# Patient Record
Sex: Male | Born: 1945 | Race: White | Hispanic: No | Marital: Married | Smoking: Never smoker
Health system: Southern US, Community
[De-identification: ages and names within clinical notes are randomized; demographics above are authoritative.]

## PROBLEM LIST (undated history)

## (undated) DIAGNOSIS — I10 Essential (primary) hypertension: Secondary | ICD-10-CM

## (undated) DIAGNOSIS — I4891 Unspecified atrial fibrillation: Secondary | ICD-10-CM

---

## 1999-12-26 ENCOUNTER — Ambulatory Visit (HOSPITAL_COMMUNITY): Admission: RE | Admit: 1999-12-26 | Discharge: 1999-12-26 | Payer: Self-pay | Admitting: Cardiology

## 2002-08-02 ENCOUNTER — Encounter: Payer: Self-pay | Admitting: Internal Medicine

## 2003-04-06 ENCOUNTER — Encounter: Payer: Self-pay | Admitting: Internal Medicine

## 2003-04-11 ENCOUNTER — Encounter: Payer: Self-pay | Admitting: Internal Medicine

## 2003-07-04 ENCOUNTER — Ambulatory Visit (HOSPITAL_COMMUNITY): Admission: RE | Admit: 2003-07-04 | Discharge: 2003-07-04 | Payer: Self-pay | Admitting: Cardiovascular Disease

## 2003-08-21 ENCOUNTER — Ambulatory Visit (HOSPITAL_COMMUNITY): Admission: RE | Admit: 2003-08-21 | Discharge: 2003-08-21 | Payer: Self-pay | Admitting: Cardiovascular Disease

## 2003-11-21 ENCOUNTER — Inpatient Hospital Stay (HOSPITAL_BASED_OUTPATIENT_CLINIC_OR_DEPARTMENT_OTHER): Admission: RE | Admit: 2003-11-21 | Discharge: 2003-11-21 | Payer: Self-pay | Admitting: *Deleted

## 2003-12-26 ENCOUNTER — Ambulatory Visit (HOSPITAL_COMMUNITY): Admission: RE | Admit: 2003-12-26 | Discharge: 2003-12-26 | Payer: Self-pay | Admitting: Cardiovascular Disease

## 2004-01-30 ENCOUNTER — Ambulatory Visit: Payer: Self-pay | Admitting: Cardiovascular Disease

## 2004-01-30 ENCOUNTER — Ambulatory Visit: Payer: Self-pay | Admitting: *Deleted

## 2004-02-13 ENCOUNTER — Ambulatory Visit: Payer: Self-pay | Admitting: Cardiology

## 2004-03-10 ENCOUNTER — Ambulatory Visit: Payer: Self-pay | Admitting: Cardiovascular Disease

## 2004-04-07 ENCOUNTER — Ambulatory Visit: Payer: Self-pay | Admitting: Cardiovascular Disease

## 2004-04-09 ENCOUNTER — Ambulatory Visit (HOSPITAL_COMMUNITY): Admission: RE | Admit: 2004-04-09 | Discharge: 2004-04-09 | Payer: Self-pay | Admitting: Cardiovascular Disease

## 2004-04-09 ENCOUNTER — Ambulatory Visit: Payer: Self-pay | Admitting: Cardiovascular Disease

## 2004-04-14 ENCOUNTER — Ambulatory Visit: Payer: Self-pay | Admitting: Internal Medicine

## 2004-04-25 ENCOUNTER — Ambulatory Visit: Payer: Self-pay | Admitting: Cardiovascular Disease

## 2004-06-24 ENCOUNTER — Ambulatory Visit: Payer: Self-pay | Admitting: Cardiology

## 2004-06-27 ENCOUNTER — Ambulatory Visit: Payer: Self-pay | Admitting: Cardiovascular Disease

## 2004-07-21 ENCOUNTER — Ambulatory Visit: Payer: Self-pay | Admitting: Internal Medicine

## 2004-08-11 ENCOUNTER — Ambulatory Visit: Payer: Self-pay | Admitting: Cardiology

## 2004-09-05 ENCOUNTER — Ambulatory Visit: Payer: Self-pay | Admitting: Cardiology

## 2004-10-01 ENCOUNTER — Ambulatory Visit: Payer: Self-pay | Admitting: Cardiology

## 2004-10-01 ENCOUNTER — Ambulatory Visit: Payer: Self-pay | Admitting: Cardiovascular Disease

## 2004-10-22 ENCOUNTER — Ambulatory Visit: Payer: Self-pay | Admitting: Cardiovascular Disease

## 2004-11-27 ENCOUNTER — Ambulatory Visit: Payer: Self-pay | Admitting: Internal Medicine

## 2004-12-11 ENCOUNTER — Ambulatory Visit: Payer: Self-pay | Admitting: Cardiology

## 2005-01-09 ENCOUNTER — Ambulatory Visit: Payer: Self-pay | Admitting: Cardiology

## 2005-01-21 ENCOUNTER — Ambulatory Visit: Payer: Self-pay | Admitting: *Deleted

## 2005-02-05 ENCOUNTER — Ambulatory Visit: Payer: Self-pay | Admitting: Internal Medicine

## 2005-02-11 ENCOUNTER — Ambulatory Visit: Payer: Self-pay | Admitting: Cardiology

## 2005-02-18 ENCOUNTER — Ambulatory Visit: Payer: Self-pay | Admitting: Internal Medicine

## 2005-02-20 ENCOUNTER — Ambulatory Visit: Payer: Self-pay | Admitting: Cardiovascular Disease

## 2005-03-04 ENCOUNTER — Ambulatory Visit: Payer: Self-pay | Admitting: Cardiology

## 2005-03-06 ENCOUNTER — Ambulatory Visit: Payer: Self-pay | Admitting: Internal Medicine

## 2005-03-31 ENCOUNTER — Ambulatory Visit: Payer: Self-pay | Admitting: Internal Medicine

## 2005-04-10 ENCOUNTER — Ambulatory Visit: Payer: Self-pay | Admitting: Internal Medicine

## 2005-04-30 ENCOUNTER — Ambulatory Visit: Payer: Self-pay | Admitting: Cardiology

## 2005-05-28 ENCOUNTER — Ambulatory Visit: Payer: Self-pay | Admitting: Cardiology

## 2005-07-17 ENCOUNTER — Ambulatory Visit: Payer: Self-pay | Admitting: Internal Medicine

## 2005-08-28 ENCOUNTER — Ambulatory Visit: Payer: Self-pay | Admitting: Internal Medicine

## 2005-09-11 ENCOUNTER — Ambulatory Visit: Payer: Self-pay | Admitting: Cardiovascular Disease

## 2006-02-15 ENCOUNTER — Ambulatory Visit: Payer: Self-pay | Admitting: Internal Medicine

## 2006-03-08 ENCOUNTER — Ambulatory Visit: Payer: Self-pay | Admitting: Cardiovascular Disease

## 2006-03-18 ENCOUNTER — Encounter: Payer: Self-pay | Admitting: Internal Medicine

## 2006-03-18 ENCOUNTER — Ambulatory Visit: Payer: Self-pay

## 2006-05-09 DIAGNOSIS — M109 Gout, unspecified: Secondary | ICD-10-CM

## 2006-09-09 ENCOUNTER — Ambulatory Visit: Payer: Self-pay | Admitting: Cardiovascular Disease

## 2006-11-01 ENCOUNTER — Encounter: Payer: Self-pay | Admitting: Internal Medicine

## 2007-03-30 ENCOUNTER — Telehealth (INDEPENDENT_AMBULATORY_CARE_PROVIDER_SITE_OTHER): Payer: Self-pay | Admitting: *Deleted

## 2007-05-04 ENCOUNTER — Telehealth: Payer: Self-pay | Admitting: Internal Medicine

## 2007-05-10 ENCOUNTER — Encounter: Payer: Self-pay | Admitting: Internal Medicine

## 2007-05-26 ENCOUNTER — Ambulatory Visit: Payer: Self-pay | Admitting: Internal Medicine

## 2007-05-26 DIAGNOSIS — T887XXA Unspecified adverse effect of drug or medicament, initial encounter: Secondary | ICD-10-CM | POA: Insufficient documentation

## 2007-05-26 LAB — CONVERTED CEMR LAB
ALT: 31 units/L (ref 0–53)
AST: 27 units/L (ref 0–37)
CO2: 28 meq/L (ref 19–32)
Chloride: 107 meq/L (ref 96–112)
Creatinine, Ser: 1.5 mg/dL (ref 0.4–1.5)
Direct LDL: 145.1 mg/dL
GFR calc Af Amer: 61 mL/min
Glucose, Bld: 113 mg/dL — ABNORMAL HIGH (ref 70–99)
HDL: 43.5 mg/dL (ref >39.0)
Hgb A1c MFr Bld: 6.2 % — ABNORMAL HIGH (ref 4.6–6.0)
Sodium: 141 meq/L (ref 135–145)
Total Bilirubin: 0.8 mg/dL (ref 0.3–1.2)
Total CHOL/HDL Ratio: 4.6
VLDL: 21 mg/dL (ref 0–40)

## 2007-06-02 ENCOUNTER — Ambulatory Visit: Payer: Self-pay | Admitting: Internal Medicine

## 2007-06-02 DIAGNOSIS — I10 Essential (primary) hypertension: Secondary | ICD-10-CM | POA: Insufficient documentation

## 2007-06-02 DIAGNOSIS — E785 Hyperlipidemia, unspecified: Secondary | ICD-10-CM | POA: Insufficient documentation

## 2007-06-02 LAB — CONVERTED CEMR LAB
HDL goal, serum: 40 mg/dL
LDL Goal: 130 mg/dL

## 2007-06-11 LAB — CONVERTED CEMR LAB: Uric Acid, Serum: 7.2 mg/dL — ABNORMAL HIGH (ref 2.4–7.0)

## 2007-06-13 ENCOUNTER — Encounter (INDEPENDENT_AMBULATORY_CARE_PROVIDER_SITE_OTHER): Payer: Self-pay | Admitting: *Deleted

## 2007-07-11 ENCOUNTER — Ambulatory Visit: Payer: Self-pay | Admitting: Internal Medicine

## 2007-07-28 ENCOUNTER — Encounter: Payer: Self-pay | Admitting: Internal Medicine

## 2007-07-28 ENCOUNTER — Ambulatory Visit: Payer: Self-pay | Admitting: Internal Medicine

## 2007-08-30 ENCOUNTER — Ambulatory Visit: Payer: Self-pay | Admitting: Cardiovascular Disease

## 2007-12-05 ENCOUNTER — Telehealth (INDEPENDENT_AMBULATORY_CARE_PROVIDER_SITE_OTHER): Payer: Self-pay | Admitting: *Deleted

## 2008-06-22 ENCOUNTER — Telehealth: Payer: Self-pay | Admitting: Internal Medicine

## 2008-06-27 ENCOUNTER — Ambulatory Visit: Payer: Self-pay | Admitting: Internal Medicine

## 2008-06-27 DIAGNOSIS — E8881 Metabolic syndrome: Secondary | ICD-10-CM

## 2008-06-27 DIAGNOSIS — I4891 Unspecified atrial fibrillation: Secondary | ICD-10-CM

## 2008-06-27 DIAGNOSIS — R350 Frequency of micturition: Secondary | ICD-10-CM

## 2008-06-28 ENCOUNTER — Encounter: Payer: Self-pay | Admitting: Internal Medicine

## 2008-07-01 LAB — CONVERTED CEMR LAB
Hgb A1c MFr Bld: 6 % (ref 4.6–6.5)
Microalb Creat Ratio: 122.1 mg/g — ABNORMAL HIGH (ref 0.0–30.0)
PSA: 3.61 ng/mL (ref 0.10–4.00)
Potassium: 4.7 meq/L (ref 3.5–5.1)

## 2008-07-02 ENCOUNTER — Telehealth (INDEPENDENT_AMBULATORY_CARE_PROVIDER_SITE_OTHER): Payer: Self-pay | Admitting: *Deleted

## 2008-07-03 ENCOUNTER — Encounter (INDEPENDENT_AMBULATORY_CARE_PROVIDER_SITE_OTHER): Payer: Self-pay | Admitting: *Deleted

## 2008-07-04 ENCOUNTER — Emergency Department (HOSPITAL_COMMUNITY): Admission: EM | Admit: 2008-07-04 | Discharge: 2008-07-05 | Payer: Self-pay | Admitting: Emergency Medicine

## 2008-07-05 ENCOUNTER — Telehealth (INDEPENDENT_AMBULATORY_CARE_PROVIDER_SITE_OTHER): Payer: Self-pay | Admitting: *Deleted

## 2008-07-06 ENCOUNTER — Encounter: Payer: Self-pay | Admitting: Internal Medicine

## 2008-07-18 ENCOUNTER — Encounter: Payer: Self-pay | Admitting: Internal Medicine

## 2008-10-10 ENCOUNTER — Encounter: Payer: Self-pay | Admitting: Internal Medicine

## 2008-10-15 ENCOUNTER — Ambulatory Visit: Payer: Self-pay | Admitting: Cardiovascular Disease

## 2008-10-22 ENCOUNTER — Telehealth: Payer: Self-pay | Admitting: Cardiovascular Disease

## 2008-10-26 ENCOUNTER — Telehealth: Payer: Self-pay | Admitting: Cardiovascular Disease

## 2008-11-05 ENCOUNTER — Telehealth: Payer: Self-pay | Admitting: Cardiovascular Disease

## 2009-02-01 ENCOUNTER — Encounter (INDEPENDENT_AMBULATORY_CARE_PROVIDER_SITE_OTHER): Payer: Self-pay | Admitting: *Deleted

## 2009-03-21 ENCOUNTER — Ambulatory Visit: Payer: Self-pay | Admitting: Internal Medicine

## 2009-03-21 DIAGNOSIS — R972 Elevated prostate specific antigen [PSA]: Secondary | ICD-10-CM | POA: Insufficient documentation

## 2009-03-22 LAB — CONVERTED CEMR LAB
ALT: 29 units/L (ref 0–53)
Albumin: 3.9 g/dL (ref 3.5–5.2)
Alkaline Phosphatase: 50 units/L (ref 39–117)
Basophils Absolute: 0 10*3/uL (ref 0.0–0.1)
Eosinophils Absolute: 0.2 10*3/uL (ref 0.0–0.7)
HCT: 48.7 % (ref 39.0–52.0)
HDL: 42.3 mg/dL (ref >39.00)
Hemoglobin: 16.2 g/dL (ref 13.0–17.0)
LDL Cholesterol: 121 mg/dL — ABNORMAL HIGH (ref 0–99)
Lymphs Abs: 2.1 10*3/uL (ref 0.7–4.0)
Microalb Creat Ratio: 27.3 mg/g (ref 0.0–30.0)
Microalb, Ur: 3.9 mg/dL — ABNORMAL HIGH (ref 0.0–1.9)
RDW: 12.5 % (ref 11.5–14.6)
Total Bilirubin: 0.9 mg/dL (ref 0.3–1.2)
Total CHOL/HDL Ratio: 4
Total Protein: 7 g/dL (ref 6.0–8.3)
Uric Acid, Serum: 7.1 mg/dL (ref 4.0–7.8)
VLDL: 14.8 mg/dL (ref 0.0–40.0)

## 2009-03-25 ENCOUNTER — Encounter (INDEPENDENT_AMBULATORY_CARE_PROVIDER_SITE_OTHER): Payer: Self-pay | Admitting: *Deleted

## 2009-10-04 ENCOUNTER — Telehealth (INDEPENDENT_AMBULATORY_CARE_PROVIDER_SITE_OTHER): Payer: Self-pay | Admitting: *Deleted

## 2010-01-20 ENCOUNTER — Encounter: Payer: Self-pay | Admitting: Internal Medicine

## 2010-01-20 ENCOUNTER — Ambulatory Visit: Payer: Self-pay | Admitting: Internal Medicine

## 2010-01-20 LAB — CONVERTED CEMR LAB
Bilirubin Urine: NEGATIVE
Nitrite: NEGATIVE
Specific Gravity, Urine: >=1.03
Urobilinogen, UA: 0.2
WBC Urine, dipstick: NEGATIVE
pH: 5

## 2010-01-27 LAB — CONVERTED CEMR LAB
ALT: 27 units/L (ref 0–53)
AST: 26 units/L (ref 0–37)
Albumin: 3.9 g/dL (ref 3.5–5.2)
Creatinine, Ser: 1.4 mg/dL (ref 0.4–1.5)
PSA: 2.65 ng/mL (ref 0.10–4.00)
Sodium: 141 meq/L (ref 135–145)
TSH: 3.48 microintl units/mL (ref 0.35–5.50)
Total Protein: 6.7 g/dL (ref 6.0–8.3)

## 2010-02-19 ENCOUNTER — Ambulatory Visit: Payer: Self-pay | Admitting: Cardiovascular Disease

## 2010-04-22 NOTE — Letter (Signed)
Summary: Results Follow up Letter  [facility] [place]  [address] [place], [place] [address]   Phone: [phone]  Fax: [phone]    03/25/2009 MRN: [id number]  PHARES ZACCONE [address] [place], [place]  [address]  Dear Mr. KARGE,  The following are the results of your recent test(s):  Test         Result    Pap Smear:        Normal _____  Not Normal _____ Comments: ______________________________________________________ Cholesterol: LDL(Bad cholesterol):         Your goal is less than:         HDL (Good cholesterol):       Your goal is more than: Comments:  ______________________________________________________ Mammogram:        Normal _____  Not Normal _____ Comments:  ___________________________________________________________________ Hemoccult:        Normal _____  Not normal _______ Comments:    _____________________________________________________________________ Other Tests: Please see attached labs done on 03/21/2009    We routinely do not discuss normal results over the telephone.  If you desire a copy of the results, or you have any questions about this information we can discuss them at your next office visit.   Sincerely,

## 2010-04-22 NOTE — Assessment & Plan Note (Signed)
Summary: roa - wife didnt know reason/cbs   Vital Signs:  Patient profile:   65 year old male Height:      74.25 inches Weight:      258.8 pounds BMI:     33.12 Temp:     97.8 degrees F oral Pulse rate:   56 / minute Resp:     14 per minute BP sitting:   140 / 82  (left arm) Cuff size:   large  Vitals Entered By: Shonna Chock CMA (January 20, 2010 8:07 AM) CC: CPX with fasting labs , Type 2 diabetes mellitus follow-up, Lipid Management Comments Patient refused to update TDaP vaccine   CC:  CPX with fasting labs , Type 2 diabetes mellitus follow-up, and Lipid Management.  History of Present Illness:     Elijah Birk is here for med refills. He checks his PT/INR @ home; Dr. Eden Emms is ordering warfarin.His wife is inquiring about the "other drug" ( Padraxa). Hypertension Follow-Up      This is a 65 year old man who presents for Hypertension follow-up.  The patient reports  some lightheadedness @ night ( therefore he stopped nighttime Doxazocin)  and occasional  urinary frequency, but denies headaches and fatigue.  The patient denies the following associated symptoms: chest pain, chest pressure, exercise intolerance, dyspnea, palpitations, syncope, leg edema, and pedal edema.  Compliance with medications (by patient report) has been near 100%.  The patient reports that dietary compliance has been good.  The patient reports exercising daily @ [employer].  Adjunctive measures currently used by the patient include salt restriction. BP @ homeaverages 135-145/ 78.  Hyperlipidemia Follow-Up      The patient also presents for Hyperlipidemia follow-up.  The patient denies muscle aches, GI upset, abdominal pain, flushing, itching, constipation, and diarrhea.  Compliance with medications (by patient report) has been  good until he ran out  of Zocor 40 mg 1 month ago. Metabolic  Syndrome ( "pre  Diabetes Mellitus")  Follow-Up        The patient reports weight loss of 15# with improved nutrition), but denies  polydipsia, blurred vision, and numbness of extremities.  Other symptoms include orthostatic symptoms ( see above).  The patient denies the following symptoms: neuropathic pain, poor wound healing, vision loss, and foot ulcer.  Since the last visit the patient reports not monitoring blood glucose.  Since the last visit, the patient reports having had no eye care.    Lipid Management History:      Positive NCEP/ATP III risk factors include male age 73 years old or older and hypertension.  Negative NCEP/ATP III risk factors include non-diabetic, no family history for ischemic heart disease, non-tobacco-user status, no ASHD (atherosclerotic heart disease), no prior stroke/TIA, no peripheral vascular disease, and no history of aortic aneurysm.     Allergies: 1)  ! Hydrochlorothiazide 2)  ! Labetalol Hcl  Past History:  Past Medical History: ATRIAL FIBRILLATION (ICD-427.31) present since 03/2003 HYPERLIPIDEMIA (ICD-272.4): NMR 2005: LDL 124( 1585/955), HDL 47,TG 76. LDL goal = < 100. Framingham Study LDL goal = < 130. HYPERTENSION, ESSENTIAL NOS (ICD-401.9) DYSMETABOLIC SYNDROME X (ICD-277.7) URINARY FREQUENCY (ICD-788.41) UNS ADVRS EFF UNS RX MEDICINAL&BIOLOGICAL SBSTNC (ICD-995.20) GOUT (ICD-274.9), PMH of  Kidney stone  X 1,09/2008, Dr Aldean Ast; elevated PSA, PMH of  Past Surgical History: Nasal septal  surgery times 3 CARDIAC CATHERIZATION 2004: negative; Rigid  sigmoid 1970; Colonoscopy 07/2007  negative ,due 2018, Dr Leone Payor  Family History:  4 maternal uncles: diabetes brother: diabetes, borderline  father: bladder cancer mother: hypertension brother: hypertension, CVA sister: diabetes brother: seizures  Social History: Never Smoked Alcohol use-yes 1-2 beers daily or less Occupation: Music therapist Married  Physical Exam  General:  Well-developed,well-nourished,in no acute distress; alert,appropriate and cooperative throughout examination Eyes:  No corneal or conjunctival  inflammation noted.Marland Kitchen Perrla. Funduscopic exam benign, without hemorrhages, exudates or papilledema Neck:  No deformities, masses, or tenderness noted. Lungs:  Normal respiratory effort, chest expands symmetrically. Lungs are clear to auscultation, no crackles or wheezes. Heart:  bradycardia and irregular rhythm.  Grade 1.5 flow murmur Abdomen:  Bowel sounds positive,abdomen soft and non-tender without masses, organomegaly or hernias noted. Pulses:  R and L carotid,radial,dorsalis pedis and posterior tibial pulses are full and equal bilaterally Extremities:  No clubbing, cyanosis, edema. Good nail health Neurologic:  alert & oriented X3, sensation intact to light touch over feet, and DTRs symmetrical and normal.   Skin:  Intact without suspicious lesions or rashes Cervical Nodes:  No lymphadenopathy noted Axillary Nodes:  No palpable lymphadenopathy Psych:  memory intact for recent and remote, normally interactive, and good eye contact.     Impression & Recommendations:  Problem # 1:  HYPERTENSION, ESSENTIAL NOS (ICD-401.9)  His updated medication list for this problem includes:    Doxazosin Mesylate 1 Mg Tabs (Doxazosin mesylate) .Marland Kitchen... 1 two times a day (take 1st dose after retiring)    Spironolactone 25 Mg Tabs (Spironolactone) .Marland Kitchen... 1 by mouth once daily    Taztia Xt 300 Mg Xr24h-cap (Diltiazem hcl er beads) .Marland Kitchen... 1 by mouth once daily  Orders: EKG w/ Interpretation ([address]) Venipuncture ([address]) TLB-BMP (Basic Metabolic Panel-BMET) (80048-METABOL)  Problem # 2:  HYPERLIPIDEMIA (ICD-272.4)  The following medications were removed from the medication list:    Pravastatin Sodium 40 Mg Tabs (Pravastatin sodium) .Marland Kitchen... 1 qhs  Orders: Venipuncture ([address]) TLB-Hepatic/Liver Function Pnl (80076-HEPATIC)  Problem # 3:  ATRIAL FIBRILLATION (ICD-427.31)  His updated medication list for this problem includes:    Warfarin Sodium 5 Mg Tabs (Warfarin sodium)    Taztia Xt 300 Mg Xr24h-cap  (Diltiazem hcl er beads) .Marland Kitchen... 1 by mouth once daily  Orders: Venipuncture ([address]11) TLB-TSH (Thyroid Stimulating Hormone) (91478-GNF) Cardiology Referral (Cardiology)  Problem # 4:  DYSMETABOLIC SYNDROME X (ICD-277.7)  strong FH DM  Orders: Venipuncture ([address]) TLB-BMP (Basic Metabolic Panel-BMET) (80048-METABOL)  Problem # 5:  PSA, INCREASED (ICD-790.93)  Orders: Venipuncture (86578) TLB-PSA (Prostate Specific Antigen) (84153-PSA)  Problem # 6:  URINARY FREQUENCY (ICD-788.41)  His updated medication list for this problem includes:    Doxazosin Mesylate 1 Mg Tabs (Doxazosin mesylate) .Marland Kitchen... 1 two times a day (take 1st dose after retiring)  Orders: Venipuncture (46962)  Complete Medication List: 1)  Doxazosin Mesylate 1 Mg Tabs (Doxazosin mesylate) .Marland Kitchen.. 1 two times a day (take 1st dose after retiring) 2)  Warfarin Sodium 5 Mg Tabs (Warfarin sodium) 3)  Spironolactone 25 Mg Tabs (Spironolactone) .Marland Kitchen.. 1 by mouth once daily 4)  Taztia Xt 300 Mg Xr24h-cap (Diltiazem hcl er beads) .Marland Kitchen.. 1 by mouth once daily  Lipid Assessment/Plan:      Based on NCEP/ATP III, the patient's risk factor category is "2 or more risk factors and a calculated 10 year CAD risk of > 20%".  The patient's lipid goals are as follows: Total cholesterol goal is 200; LDL cholesterol goal is 130; HDL cholesterol goal is 40; Triglyceride goal is 150.  His LDL cholesterol goal has not been met.  Secondary causes for hyperlipidemia have been ruled out.  He has been counseled on adjunctive measures for lowering his cholesterol and has been provided with dietary instructions.    Patient Instructions: 1)  Please se Dr Eden Emms to discuss Coumadin. 2)  It is important that you exercise regularly at least 20 minutes 5 times a week. If you develop chest pain, have severe difficulty breathing, or feel very tired , stop exercising immediately and seek medical attention. 3)  See your eye doctor yearly to check for diabetic eye  damage. 4)  Check your feet each night for sore areas, calluses or signs of infection. 5)  Check your Blood Pressure regularly. If it is above: 135/85 ON AVERAGE  you should make an appointment. Prescriptions: SPIRONOLACTONE 25 MG TABS (SPIRONOLACTONE) 1 by mouth once daily  #90 x 3   Entered and Authorized by:   Marga Melnick MD   Signed by:   Marga Melnick MD on 01/20/2010   Method used:   Print then Give to Patient   RxID:   [id number] TAZTIA XT 300 MG XR24H-CAP (DILTIAZEM HCL ER BEADS) 1 by mouth once daily  #90 x 3   Entered and Authorized by:   Marga Melnick MD   Signed by:   Marga Melnick MD on 01/20/2010   Method used:   Print then Give to Patient   RxID:   [id number]    Orders Added: 1)  Est. Patient Level IV [32440] 2)  EKG w/ Interpretation [93000] 3)  Venipuncture [36415] 4)  TLB-BMP (Basic Metabolic Panel-BMET) [80048-METABOL] 5)  TLB-Hepatic/Liver Function Pnl [80076-HEPATIC] 6)  TLB-TSH (Thyroid Stimulating Hormone) [84443-TSH] 7)  TLB-PSA (Prostate Specific Antigen) [10272-ZDG] 8)  Cardiology Referral [Cardiology]

## 2010-04-22 NOTE — Miscellaneous (Signed)
Summary: U dip  Clinical Lists Changes  Orders: Added new Test order of TLB-Udip ONLY (81003-UDIP) - Signed Observations: Added new observation of COMMENTS: No cause for culture. Lucious Groves CMA  January 20, 2010 10:20 AM  (01/20/2010 10:19) Added new observation of PH URINE: 5.0  (01/20/2010 10:19) Added new observation of SPEC GR URIN: >=1.030  (01/20/2010 10:19) Added new observation of APPEARANCE U: Clear  (01/20/2010 10:19) Added new observation of UA COLOR: yellow  (01/20/2010 10:19) Added new observation of WBC DIPSTK U: negative  (01/20/2010 10:19) Added new observation of NITRITE URN: negative  (01/20/2010 10:19) Added new observation of UROBILINOGEN: 0.2  (01/20/2010 10:19) Added new observation of PROTEIN, URN: negative  (01/20/2010 10:19) Added new observation of BLOOD UR DIP: negative  (01/20/2010 10:19) Added new observation of KETONES URN: negative  (01/20/2010 10:19) Added new observation of BILIRUBIN UR: negative  (01/20/2010 10:19) Added new observation of GLUCOSE, URN: negative  (01/20/2010 10:19)     Laboratory Results   Urine Tests  Date/Time Received: Lucious Groves [facility]  January 20, 2010 10:20 AM Date/Time Reported: Lucious Groves CMA  January 20, 2010 10:20 AM  Routine Urinalysis   Color: yellow Appearance: Clear Glucose: negative   (Normal Range: Negative) Bilirubin: negative   (Normal Range: Negative) Ketone: negative   (Normal Range: Negative) Spec. Gravity: >=1.030   (Normal Range: 1.003-1.035) Blood: negative   (Normal Range: Negative) pH: 5.0   (Normal Range: 5.0-8.0) Protein: negative   (Normal Range: Negative) Urobilinogen: 0.2   (Normal Range: 0-1) Nitrite: negative   (Normal Range: Negative) Leukocyte Esterace: negative   (Normal Range: Negative)    Comments: No cause for culture. Lucious Groves CMA  January 20, 2010 10:20 AM

## 2010-04-22 NOTE — Assessment & Plan Note (Signed)
Summary: rov   CC:  check up.Marland KitchenMarland Kitchenpt will call us about his meds he takes he does not know the name of them.  History of Present Illness: Gemayel is seen today for F/U of hypertension, chronic atrial fibrillation, and edema.  he has no coronary artery disease with good LV function.  He failed for cardioversion attempts including antiarrhythmic therapy.  He is asymptomatic.  He has a Coumadin machine at home Asked him to bring his unit and to check with our Coumadin clinic at no charge to make sure it is accurate.  Not had any bleeding diathesis.  He had previously taking 5 mg of Coumadin a day but takes 2-1/2 mg now from time to time since he lost about 20 pounds.  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  Denies any significant chest pain PND orthopnea his lower extremity edema is improved. I suspect this is from his weight loss.  He has been compliant with his medications..  Blood pressure is under good control.  Discussed Pradaxa He will be a good candidate for it when there is an antidote  Current Problems (verified): 1)  Psa, Increased  (ICD-790.93) 2)  Atrial Fibrillation  (ICD-427.31) 3)  Hyperlipidemia  (ICD-272.4) 4)  Hypertension, Essential Nos  (ICD-401.9) 5)  Dysmetabolic Syndrome X  (ICD-277.7) 6)  Urinary Frequency  (ICD-788.41) 7)  Uns Advrs Eff Uns Rx Medicinal&biological Sbstnc  (ICD-995.20) 8)  Gout  (ICD-274.9)  Current Medications (verified): 1)  Doxazosin Mesylate 1 Mg Tabs (Doxazosin Mesylate) .Marland Kitchen.. 1 Two Times A Day (Take 1st Dose After Retiring) 2)  Warfarin Sodium 5 Mg Tabs (Warfarin Sodium) .... As Directed 3)  Spironolactone 25 Mg Tabs (Spironolactone) .Marland Kitchen.. 1 By Mouth Once Daily 4)  Taztia Xt 300 Mg Xr24h-Cap (Diltiazem Hcl Er Beads) .Marland Kitchen.. 1 By Mouth Once Daily  Allergies (verified): 1)  ! Hydrochlorothiazide 2)  ! Labetalol Hcl  Past History:  Past Medical History: Last updated: 01/20/2010 ATRIAL FIBRILLATION (ICD-427.31) present since 03/2003 HYPERLIPIDEMIA  (ICD-272.4): NMR 2005: LDL 124( 1585/955), HDL 47,TG 76. LDL goal = < 100. Framingham Study LDL goal = < 130. HYPERTENSION, ESSENTIAL NOS (ICD-401.9) DYSMETABOLIC SYNDROME X (ICD-277.7) URINARY FREQUENCY (ICD-788.41) UNS ADVRS EFF UNS RX MEDICINAL&BIOLOGICAL SBSTNC (ICD-995.20) GOUT (ICD-274.9), PMH of  Kidney stone  X 1,09/2008, Dr Aldean Ast; elevated PSA, PMH of  Past Surgical History: Last updated: 01/20/2010 Nasal septal  surgery times 3 CARDIAC CATHERIZATION 2004: negative; Rigid  sigmoid 1970; Colonoscopy 07/2007  negative ,due 2018, Dr Leone Payor  Family History: Last updated: 01/20/2010  4 maternal uncles: diabetes brother: diabetes, borderline father: bladder cancer mother: hypertension brother: hypertension, CVA sister: diabetes brother: seizures  Social History: Last updated: 01/20/2010 Never Smoked Alcohol use-yes 1-2 beers daily or less Occupation: Music therapist Married  Review of Systems       Denies fever, malais, weight loss, blurry vision, decreased visual acuity, cough, sputum, SOB, hemoptysis, pleuritic pain, palpitaitons, heartburn, abdominal pain, melena, lower extremity edema, claudication, or rash.   Vital Signs:  Patient profile:   65 year old male Height:      74 inches Weight:      258 pounds BMI:     33.24 Pulse rate:   41 / minute Resp:     14 per minute BP sitting:   135 / 76  (left arm)  Vitals Entered By: Kem Parkinson (February 19, 2010 8:54 AM)  Physical Exam  General:  Affect appropriate Healthy:  appears stated age HEENT: normal Neck supple with no  adenopathy JVP normal no bruits no thyromegaly Lungs clear with no wheezing and good diaphragmatic motion Heart:  S1/S2 systolic murmur,rub, gallop or click PMI normal Abdomen: benighn, BS positve, no tenderness, no AAA no bruit.  No HSM or HJR Distal pulses intact with no bruits No edema Neuro non-focal Skin warm and dry    Impression & Recommendations:  Problem # 1:   ATRIAL FIBRILLATION (ICD-427.31) "Good rate control and anticoagulaiton.  Refill called in.  Continue home monitoring.   His updated medication list for this problem includes:    Warfarin Sodium 5 Mg Tabs (Warfarin sodium) .Marland Kitchen... As directed  Problem # 2:  HYPERLIPIDEMIA (ICD-272.4) Improved with significant weight loss At target with no vascular disease CHOL: 178 (03/21/2009)   LDL: 121 (03/21/2009)   HDL: 42.30 (03/21/2009)   TG: 74.0 (03/21/2009) CHOL (goal): 200 (06/02/2007)   LDL (goal): 130 (06/02/2007)   HDL (goal): 40 (06/02/2007)   TG (goal): 150 (06/02/2007)  Problem # 3:  HYPERTENSION, ESSENTIAL NOS (ICD-401.9) Well controlled His updated medication list for this problem includes:    Doxazosin Mesylate 1 Mg Tabs (Doxazosin mesylate) .Marland Kitchen... 1 two times a day (take 1st dose after retiring)    Spironolactone 25 Mg Tabs (Spironolactone) .Marland Kitchen... 1 by mouth once daily    Taztia Xt 300 Mg Xr24h-cap (Diltiazem hcl er beads) .Marland Kitchen... 1 by mouth once daily  BP today: 135/76 Prior BP: 140/82 (01/20/2010)  Prior 10 Yr Risk Heart Disease: 22 % (01/20/2010)  Labs Reviewed: K+: 4.8 (01/20/2010) Creat: : 1.4 (01/20/2010)   Chol: 178 (03/21/2009)   HDL: 42.30 (03/21/2009)   LDL: 121 (03/21/2009)   TG: 74.0 (03/21/2009)  Patient Instructions: 1)  Your physician recommends that you schedule a follow-up appointment in: 6 MONTHS WITH DR Eden Emms 2)  Your physician recommends that you continue on your current medications as directed. Please refer to the Current Medication list given to you today.

## 2010-04-22 NOTE — Progress Notes (Signed)
Summary: NEEDS LOCAL AND [facility] REFILL  Phone Note Call from Patient Call back at Home Phone [phone] Call back at Work Phone [phone]   Caller: Spouse Summary of Call: NEEDS REFILL FOR SPIRONOLACTONE 25 --ONLY HAS TWO PILLS LEFT  PLEASE CALL MAIL ORDER--[facility] FOR 90 DAY PRESCRIPTIONS PLUS REFILLS  ALSO NEEDS 30 DAY SUPPLY SENT TO [facility] [place] Initial call taken by: Jerolyn Shin,  October 04, 2009 11:25 AM    Prescriptions: SPIRONOLACTONE 25 MG TABS (SPIRONOLACTONE) 1 by mouth once daily  #90 x 0   Entered by:   Shonna Chock CMA   Authorized by:   Marga Melnick MD   Signed by:   Shonna Chock CMA on 10/04/2009   Method used:   Faxed to ...       [facility] (mail-order)             , [place]         Ph: [phone]       Fax: [phone]   RxID:   [id number] SPIRONOLACTONE 25 MG TABS (SPIRONOLACTONE) 1 by mouth once daily  #30 x 0   Entered by:   Shonna Chock CMA   Authorized by:   Marga Melnick MD   Signed by:   Shonna Chock CMA on 10/04/2009   Method used:   Electronically to        [facility]. [phone]* (retail)       [address]       [place], [place]  [address]       Ph: [phone]       Fax: [phone]   RxID:   [id number]

## 2010-05-02 ENCOUNTER — Encounter: Payer: Self-pay | Admitting: Internal Medicine

## 2010-05-14 NOTE — Medication Information (Signed)
Summary: Medication Non Adherence Advisory-Taztia XT  Medication Non Adherence Advisory-Taztia XT   Imported By: Maryln Gottron 05/07/2010 14:42:02  _____________________________________________________________________  External Attachment:    Type:   Image     Comment:   External Document

## 2010-07-02 LAB — CBC
MCHC: 33.6 g/dL (ref 30.0–36.0)
RBC: 5.27 MIL/uL (ref 4.22–5.81)
RDW: 13.8 % (ref 11.5–15.5)
WBC: 12.8 10*3/uL — ABNORMAL HIGH (ref 4.0–10.5)

## 2010-07-02 LAB — POCT I-STAT, CHEM 8
Calcium, Ion: 1.06 mmol/L — ABNORMAL LOW (ref 1.12–1.32)
Chloride: 109 mEq/L (ref 96–112)
Glucose, Bld: 112 mg/dL — ABNORMAL HIGH (ref 70–99)
Potassium: 5.4 mEq/L — ABNORMAL HIGH (ref 3.5–5.1)
Sodium: 137 mEq/L (ref 135–145)
TCO2: 25 mmol/L (ref 0–100)

## 2010-07-02 LAB — URINE CULTURE: Culture: NO GROWTH

## 2010-07-02 LAB — URINALYSIS, ROUTINE W REFLEX MICROSCOPIC
Specific Gravity, Urine: 1.018 (ref 1.005–1.030)
pH: 6.5 (ref 5.0–8.0)

## 2010-07-02 LAB — URINE MICROSCOPIC-ADD ON

## 2010-07-02 LAB — HEPATIC FUNCTION PANEL
Albumin: 3.8 g/dL (ref 3.5–5.2)
Alkaline Phosphatase: 69 U/L (ref 39–117)
Bilirubin, Direct: 0.2 mg/dL (ref 0.0–0.3)
Indirect Bilirubin: 0.4 mg/dL (ref 0.3–0.9)

## 2010-07-02 LAB — PROTIME-INR: Prothrombin Time: 28.1 seconds — ABNORMAL HIGH (ref 11.6–15.2)

## 2010-07-02 LAB — DIFFERENTIAL
Eosinophils Absolute: 0.2 10*3/uL (ref 0.0–0.7)
Eosinophils Relative: 1 % (ref 0–5)
Neutro Abs: 9.6 10*3/uL — ABNORMAL HIGH (ref 1.7–7.7)

## 2010-08-05 NOTE — Assessment & Plan Note (Signed)
[facility]                            CARDIOLOGY OFFICE NOTE   NAME:Berkemeier, KELL FERRIS                       MRN:          [id number]  DATE:09/09/2006                            DOB:          07/26/1945    Hayden [name] returns today for followup. He has chronic A fib with failed  cardioversions x4. His anticoagulation has been good. His wife has  actually been checking it at home with a machine from work. They do not  like spending the $75 a pop at our office that the insurance does not  cover.   His INRs have been fine. His rate control has been good on labetalol.   He has not had any significant palpitations, PND, orthopnea, or syncope.  There has been no previous coronary disease. I believe he had a normal  cath in 2001 and his last Myoview was normal in 2005. I told him we  should probably check another stress test in a year.   He unfortunately continues to be overweight, we talked about increasing  his exercise level and decreasing his caloric intake. He tends to be  fairly busy at [employer] but only works a few hours a day.   He has been compliant with his medications. He is currently taking:  1. Coumadin.  2. Cardia 300 a day.  3. Aspirin a day.  4. Labetalol 300 a day.  5. Spironolactone 25 a day.   PHYSICAL EXAMINATION:  VITAL SIGNS:  Blood pressure is 145/80, pulse is  81 and irregular. He is afebrile. Respiratory rate is 16, resting sats  are normal. He is afebrile.  HEENT:  Normal. There is no thyromegaly, no lymphadenopathy, no carotid  bruits.  LUNGS:  Clear with diaphragmatic motion, no wheezing. There is an S1,  S2, distant heart sounds. PMI is not palpable.  ABDOMEN:  Protuberant. Bowel sounds positive, there is no tenderness, no  hepatosplenomegaly, or hepatojugular reflux. No AAA. Femorals were +3  bilaterally without bruits.  EXTREMITIES:  Distal pulses were intact with trace edema.  NEUROLOGIC:  Nonfocal. There is no muscular  weakness.   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   IMPRESSION:  1. Chronic atrial fibrillation, good control on labetalol and cardia.      Continue anticoagulation with Coumadin, failed Cardioversion x4, no      need to repeat. The patient had a lot of questions regarding the      possibility of substituting aspirin and Plavix for Coumadin and I      explained to him in a brick and mortar type analogy that they were      not equivalent, he seemed to understand this.  2. Lower extremity edema currently fine on spironolactone. I am not      sure why this diuretic was chosen by his primary care MD. He has      not been hyperkalemic in the past. His last potassium that I have      in the chart is 4.0 and we will continue this.  3. Borderline  hypertension currently stable with cardia and labetalol      that he takes for his A fib rate control. Continue low salt diet.  4. Central obesity. Increase activity level, decrease caloric count.  5. Risk factor modification and surveillance. Followup stress test in      a year.     Noralyn Pick. Eden Emms, MD, [facility]  Electronically Signed    PCN/MedQ  DD: 09/09/2006  DT: 09/10/2006  Job #: [id number]

## 2010-08-05 NOTE — Assessment & Plan Note (Signed)
[facility]                            CARDIOLOGY OFFICE NOTE   NAME:[name], [name]                       MRN:          [id number]  DATE:08/30/2007                            DOB:          [birth year]    Hayden [name] returns today for followup.  He is in chronic AFib.  He failed  cardioversion x4 including on flecainide.  Rate control has been good.  He is not having palpitations, chest pain, PND, orthopnea, or  presyncope.   He does have hypertension.  I am a little bit concerned about Hayden [name]'  weight.  Apparently, Dr. Alwyn Ren has him on Sugar Busters.  He would  appear to be a type 2 diabetic.  I do not have the hemoglobin A1c on  him.  His cholesterol in the past has been elevated, but he is not on a  statin drug.  He has no documented coronary artery disease.   Apparently, there were some issues with hydrochlorothiazide in the past.  Dr. Alwyn Ren has him on spironolactone.  I do not have recent labs on him  with regards to his potassium.  He had a recent colonoscopy and since  then his Coumadin has been a little harder to regulate.  His wife  actually monitors it at home.   REVIEW OF SYSTEMS:  Otherwise, his review of systems is negative.   1. He is on Coumadin as directed by his INRs.  2. Cartia 300 mg.  3. Spironolactone 25 mg b.i.d.  4. Labetalol 300 mg b.i.d.   PHYSICAL EXAMINATION:  Remarkable for an overweight white male in no  distress.  He is at 272, blood pressure is 140/80, pulse 57 and regular,  respiratory rate 14, and afebrile.  HEENT:  Unremarkable.  Carotids normal without bruit.  No lymphadenopathy, thyromegaly, or JVP  elevation.  LUNGS:  Clear and good diaphragmatic motion.  No wheezing.  S1 and S2 with normal heart sounds.  PMI is not palpable.  ABDOMEN:  Protuberant.  Bowel sounds positive.  No AAA.  No tenderness.  No bruit.  No hepatosplenomegaly or hepatojugular reflux.  Distal pulses are intact with trace edema.  NEURO:   Nonfocal.  SKIN:  Warm and dry.  No muscular weakness.   EKG shows AFib with nonspecific ST-T wave changes and occasional PVC.   IMPRESSION:  1. Atrial fibrillation, good rate control, and anticoagulation.  No      complications.  2. Hypertension, borderline control.  The patient needs to lose weight      and cut out the salt in his diet.  At some point in time, it may be      better to switch him from [place] to an ACE inhibitor.  I will leave      this up to Dr. Alwyn Ren; however, the patient is currently      asymptomatic.  3. Lower extremity edema, at baseline.  Cut out salt in diet.      Continue spironolactone.  Follow up BMET per Dr. Alwyn Ren.  4. Type 2 diabetes.  Follow up with Dr. Alwyn Ren.  The patient says  he      is trying to follow a Sugar Busters diet.  I explained him the      relationship between his weight and type 2 diabetes and insulin      resistance.  He is trying to walk on a regular basis.   I will see the patient back in a year.     Hayden Pick. Eden Emms, MD, [facility]  Electronically Signed    PCN/MedQ  DD: 08/30/2007  DT: 08/31/2007  Job #: [id number]

## 2010-08-05 NOTE — Assessment & Plan Note (Signed)
[facility]                         GASTROENTEROLOGY OFFICE NOTE   NAME:Hayden [name], Hayden [name]                       MRN:          [id number]  DATE:07/11/2007                            DOB:          03-Dec-1945    CHIEF COMPLAINT:  Needs colonoscopy, takes Coumadin.   HISTORY:  This is a 65 year old white man with chronic atrial  fibrillation.  He has no GI symptoms of bleeding, bowel habit change.  He is sent for screening colonoscopy but needs review because of  Coumadin use.  His INR is checked weekly as well, in fact his wife  checks it through work.   MEDICATIONS:  1. Coumadin 5 mg daily.  2. Cartia XT 300 mg daily.  3. Spironolactone 25 mg b.i.d.  4. Labetalol 30 mg b.i.d.   PAST MEDICAL HISTORY:  1. Atrial fibrillation.  2. Metabolic syndrome.  3. Type 2 diabetes mellitus.  4. Hypertension.  5. Dyslipidemia.  6. Gout.   PAST SURGICAL HISTORY:  Nasal septal surgery.   DRUG ALLERGIES:  HCTZ reported, not described above.   FAMILY HISTORY:  No colon cancer.   SOCIAL HISTORY:  Married, works for [employer], 3 children,  occasional alcohol.  No tobacco or drugs.  He is a veteran of [place].  He served in [employer] for 5 years.   REVIEW OF SYSTEMS:  Eyeglasses or contact lenses used.  All other  systems are negative at this time.   PHYSICAL EXAMINATION:  VITAL SIGNS:  Height 6 feet 4 inches, weight 277  pounds, blood pressure 116/70, pulse 60 irregular.  GENERAL:  This is an obese white man in no acute distress.  EYES:  Anicteric.  ENT:  Normal mouth/posterior pharynx.  NECK:  Supple.  CHEST:  Clear.  HEART:  S1 S2.  I hear no rubs, murmurs, or gallops.  There is an  irregular rhythm with a normal rate.  ABDOMEN:  Obese, soft, and nontender.  LOWER EXTREMITIES:  Free of edema.  PSYCH:  He is alert and oriented x3.   ASSESSMENT:  This man is at average risk for colon cancer and polyps but  has chronic need for Coumadin because of  atrial fibrillation.   This raises the risk of his colonoscopy some because he most likely  should come off the Coumadin to allow for a snare polypectomy with  cautery if needed at the procedure.  I have explained the risks,  benefits, indications, including risks of bleeding, infection,  medication reaction, perforation, possible need for surgery and the  added risk of possible stroke off of Coumadin.  He understands and  agrees to proceed.   PLAN:  1. We will schedule the colonoscopy.  2. We will obtain clearance from Dr. Eden Emms regarding holding his      Coumadin 4-5 days prior to the procedure.  Further plans pending      this.     Iva Boop, MD,FACG  Electronically Signed    CEG/MedQ  DD: 07/11/2007  DT: 07/11/2007  Job #: [id number]   cc:   Titus Dubin. Alwyn Ren, MD,FACP,FCCP  Noralyn Pick. Eden Emms,  MD, [facility]

## 2010-08-08 NOTE — Op Note (Signed)
NAMELADD, CEN NO.:  [id number]   MEDICAL RECORD NO.:  [id number]          PATIENT TYPE:  OIB   LOCATION:  2899                         FACILITY:  [facility]   PHYSICIAN:  Charlton Haws, M.D.     DATE OF BIRTH:  09-02-1945   DATE OF PROCEDURE:  DATE OF DISCHARGE:                                 OPERATIVE REPORT   PROCEDURE:  Cardioversion.   Mr. Pann is a 65 year old patient with atrial fibrillation.  He has normal  LV function, no coronary artery disease.  We have previously tried to  cardiovert him on Betapace and flecainide at 100 b.i.d.  He has since  increased his dose to 150 b.i.d. of flecainide.  He had a negative stress  test for proarrhythmia.  He had a clean cath recently.  He has been on  therapeutic Coumadin over the last 6 weeks.  His INR two days ago was 2.8.   The patient was anesthetized by anesthesia with 200 mg of sodium Pentothal.  A single 150 joule biphasic shock was delivered.  The patient converted from  afib at a rate of 90 to sinus rhythm at a rate of 62.   IMPRESSION:  Successful cardioversion with restoration to sinus rhythm.  The  patient will continue his Coumadin and flecainide.  He will get his Coumadin  level checked Monday at our office.  I will see him back in 3 or 4 weeks.       PN/MEDQ  D:  12/26/2003  T:  12/26/2003  Job:  [id number]

## 2010-08-08 NOTE — Cardiovascular Report (Signed)
NAME:  ROBINSON, BRINKLEY                          ACCOUNT NO.:  [id number]   MEDICAL RECORD NO.:  [id number]                   PATIENT TYPE:  OIB   LOCATION:  6501                                 FACILITY:  [facility]   PHYSICIAN:  Carole Binning, M.D. [facility]         DATE OF BIRTH:  01/19/1946   DATE OF PROCEDURE:  11/21/2003  DATE OF DISCHARGE:  11/21/2003                              CARDIAC CATHETERIZATION   PROCEDURE PERFORMED:  Left heart catheterization with coronary angiography  and left ventriculography.   INDICATION:  Mr. Carneiro is a 65 year old male with history of atrial  fibrillation.  He has been treated with flecainide therapy.  In January  2005, he underwent a Cardiolite scan which was read as consistent with a  mild basal inferior wall infarction and possibly some apical thinning.  Of  note, he had a similarly abnormal Cardiolite scan in 2001 and underwent  cardiac catheterization which showed no coronary artery disease.  Nevertheless, because of the need for class Ic antiarrhythmic therapy it was  felt best to proceed with cardiac catheterization to rule out coronary  artery disease in order to ensure that antiarrhythmic therapy would be safe.   CATHETERIZATION PROCEDURAL NOTE:  A 4 French sheath was placed in the right  femoral artery.  Left coronary arteries were imaged with a 4 French JL-4  catheter.  The right coronary artery has a high anterior take off and was  imaged with an AL-1 catheter.  Left ventriculography was performed with an  angled pigtail catheter.  Contrast was Omnipaque.  There were no  complications.   CATHETERIZATION RESULTS:   HEMODYNAMICS:  1.  Left ventricular pressure 140/16.  2.  Aortic pressure 134/88.  3.  There is no aortic valve gradient.   LEFT VENTRICULOGRAM:  There appears to be mild generalized hypokinesis of  the left ventricle.  Ejection fraction estimated at 50-55%.  There is trace  mitral regurgitation.   CORONARY ARTERIOGRAPHY  (CO-DOMINANT):  Left main is normal.   Left anterior descending artery gives rise to a large branching first  diagonal and a small second diagonal.  The left anterior descending artery  is normal.   Left circumflex is a large co-dominant vessel.  It gives rise to a normal  size obtuse marginal and two normal size posterior lateral branches.  The  left circumflex is normal.   Right coronary is a co-dominant vessel.  It has a high anterior origin.  The  right coronary artery ends as a small to normal size posterior descending  artery.  The right coronary artery is normal.   IMPRESSION:  1.  Low normal left ventricular systolic function.  2.  Normal coronary arteries.  Carole Binning, M.D. [facility]    MWP/MEDQ  D:  11/21/2003  T:  11/21/2003  Job:  [id number]   cc:   Charlton Haws, M.D.   Titus Dubin. Alwyn Ren, M.D. [facility]   Duke Salvia, M.D.

## 2010-08-08 NOTE — Cardiovascular Report (Signed)
[place]  Patient:    Hayden [name], Hayden [name]                       MRN: [id number] Proc. Date: 12/26/99 Adm. Date:  [id number] Disc. Date: [id number] Attending:  Lenoria Farrier CC:         Titus Dubin. Alwyn Ren, M.D. [facility]  Theron Arista C. Eden Emms, M.D. [facility]  Cardiopulmonary Laboratory   Cardiac Catheterization  PROCEDURES PERFORMED:  Cardiac catheterization.  CLINICAL HISTORY:  Mr. [name] is 65 years old and has no prior history of known heart disease.  He works for [employer] and does have some positive risk factors for coronary disease.  Dr. Alwyn Ren recently did a Cardiolite scan on him which showed inferior scar with what was felt to be superimposed ischemia. For this reason he was scheduled for evaluation with catheterization.  DESCRIPTION OF PROCEDURE:  The procedure was performed via the right femoral artery using an arterial sheath and 6 French preformed coronary catheters.  A front wall arterial puncture was performed and Omnipaque contrast was used. We had a great deal of difficulty selectively injecting the right coronary. artery.  After tying multiple catheters we were finally successful with an AL-2 catheter.  We did an aortic root to help identify the origin of the right coronary artery.  The patient tolerated the procedure well and left the laboratory in satisfactory condition.  RESULTS:  The left main coronary artery:  The left main coronary artery was free of significant disease.  Left anterior descending:  The left anterior descending artery gave rise to two diagonal branches and two septal perforators.  These and the LAD proper were free of significant disease.  Circumflex artery:  The circumflex artery gave rise to a marginal branch and two posterolateral branches.  These vessels were free of significant disease.  Right coronary artery:  The right coronary is a small vessel that gave rise to only a posterior descending branch.   This vessel was free of significant disease.  LEFT VENTRICULOGRAPHY:  The left ventriculogram performed in the RAO projection showed good wall motion with no areas of hypokinesis. The estimated ejection fraction was 60%.  An aortic root injection showed no aortic insufficiency.  CONCLUSIONS:  Normal coronary angiography and left ventricular wall motion.  RECOMMENDATIONS:  Reassurance.  In view of these findings I think the recent Cardiolite scan probably represents a false-positive scan.  We will plan continued risk factor modification and reassurance. DD:  12/26/99 TD:  12/27/99 Job: [id number]

## 2010-08-08 NOTE — Assessment & Plan Note (Signed)
[facility]                            CARDIOLOGY OFFICE NOTE   NAME:Hayden [name], Hayden [name]                       MRN:          [id number]  DATE:03/08/2006                            DOB:          November 14, 1945    HISTORY OF PRESENT ILLNESS:  Hayden [name] returns today for follow up of his  chronic atrial fibrillation.  He failed multiple cardioversions in the  past.  His wife checks his Coumadin level almost every 10 days.  I told  him this was a bit excessive, but apparently it does not cost him any  money.  His INR has been running between 24 and 29.  He has not had any  bleeding problems.  I will see if he is a candidate for Rely or one of  the Coumadin alternative drugs.   He has not had any significant palpitations, PND, orthopnea, chest pain  on review of systems.   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   PHYSICAL EXAMINATION:  VITAL SIGNS:  He is in atrial fibrillation at a  rate of 80.  Blood pressure 130/70.  HEENT:  Normal.  No lymphadenopathy or carotid bruits.  No thyromegaly.  LUNGS:  Clear.  HEART:  S1, S2, normal heart sounds.  ABDOMEN:  Benign.  EXTREMITIES:  Lower extremities with normal pulses.  No edema.  NEUROLOGICAL:  Nonfocal.  SKIN:  Warm and dry.   IMPRESSION:  Chronic atrial fibrillation, good rate control.  Continue  Labetalol 300 b.i.d. and Cardia 300 daily.   The patient may need a colonoscopy per his primary MD.  I told him it  was okay to stop his Coumadin without an overlap.   Research will see him to see if he is a candidate for any XA inhibitor,  oral XA inhibitor drugs.  Otherwise, we will do a 2D echocardiogram.  He  has not had his LV function assessed in over 2-1/2 years, and I would  like to do this since he is in chronic atrial fibrillation.   As long as his echo looks fine, I will see him back in about six months.     Noralyn Pick. Eden Emms, MD, [facility]  Electronically Signed    PCN/MedQ  DD: 03/08/2006  DT: 03/08/2006  Job #: [id number]

## 2010-11-15 ENCOUNTER — Other Ambulatory Visit: Payer: Self-pay | Admitting: Cardiovascular Disease

## 2011-12-04 ENCOUNTER — Telehealth: Payer: Self-pay | Admitting: Cardiovascular Disease

## 2011-12-04 NOTE — Telephone Encounter (Signed)
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 12/04/11 rmf

## 2011-12-04 NOTE — Telephone Encounter (Signed)
Patient showed up stating he spoke with someone and was told to pick up information today by 5:00.  Explained to [employer] laboratory technician) that no one in the message/triage room spoke with patient today. She is currently speaking with patient to try to resolve

## 2011-12-04 NOTE — Telephone Encounter (Signed)
Left message to call back  

## 2011-12-04 NOTE — Telephone Encounter (Signed)
F/u  Was advise from medical records to contact the nurse.  Last seen Dr. Eden Emms about a year ago .  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 Need to be turn in today.

## 2011-12-04 NOTE — Telephone Encounter (Signed)
plz return call to patient regarding work release ltr. [phone]

## 2013-04-07 ENCOUNTER — Encounter (HOSPITAL_COMMUNITY): Payer: Self-pay | Admitting: Emergency Medicine

## 2013-04-07 ENCOUNTER — Emergency Department (HOSPITAL_COMMUNITY): Payer: Non-veteran care

## 2013-04-07 ENCOUNTER — Emergency Department (HOSPITAL_COMMUNITY)
Admission: EM | Admit: 2013-04-07 | Discharge: 2013-04-07 | Disposition: A | Discharge status: Home / Self Care | Payer: Non-veteran care | Attending: Emergency Medicine | Admitting: Emergency Medicine

## 2013-04-07 DIAGNOSIS — Z79899 Other long term (current) drug therapy: Secondary | ICD-10-CM | POA: Insufficient documentation

## 2013-04-07 DIAGNOSIS — R55 Syncope and collapse: Secondary | ICD-10-CM | POA: Insufficient documentation

## 2013-04-07 DIAGNOSIS — I10 Essential (primary) hypertension: Secondary | ICD-10-CM | POA: Insufficient documentation

## 2013-04-07 DIAGNOSIS — R42 Dizziness and giddiness: Secondary | ICD-10-CM | POA: Insufficient documentation

## 2013-04-07 DIAGNOSIS — Z7901 Long term (current) use of anticoagulants: Secondary | ICD-10-CM | POA: Insufficient documentation

## 2013-04-07 DIAGNOSIS — I4891 Unspecified atrial fibrillation: Secondary | ICD-10-CM | POA: Insufficient documentation

## 2013-04-07 DIAGNOSIS — Z8673 Personal history of transient ischemic attack (TIA), and cerebral infarction without residual deficits: Secondary | ICD-10-CM | POA: Insufficient documentation

## 2013-04-07 HISTORY — DX: Essential (primary) hypertension: I10

## 2013-04-07 HISTORY — DX: Unspecified atrial fibrillation: I48.91

## 2013-04-07 LAB — CBC WITH DIFFERENTIAL/PLATELET
Basophils Absolute: 0.1 10*3/uL (ref 0.0–0.1)
Basophils Relative: 1 % (ref 0–1)
Eosinophils Absolute: 0.2 10*3/uL (ref 0.0–0.7)
Eosinophils Relative: 2 % (ref 0–5)
HEMATOCRIT: 45.6 % (ref 39.0–52.0)
Hemoglobin: 16.1 g/dL (ref 13.0–17.0)
LYMPHS ABS: 3.8 10*3/uL (ref 0.7–4.0)
Lymphocytes Relative: 35 % (ref 12–46)
MCH: 30.6 pg (ref 26.0–34.0)
MCHC: 35.3 g/dL (ref 30.0–36.0)
MCV: 86.7 fL (ref 78.0–100.0)
MONO ABS: 0.9 10*3/uL (ref 0.1–1.0)
MONOS PCT: 8 % (ref 3–12)
NEUTROS ABS: 5.8 10*3/uL (ref 1.7–7.7)
NEUTROS PCT: 54 % (ref 43–77)
Platelets: 279 10*3/uL (ref 150–400)
RBC: 5.26 MIL/uL (ref 4.22–5.81)
RDW: 12.9 % (ref 11.5–15.5)
WBC: 10.6 10*3/uL — ABNORMAL HIGH (ref 4.0–10.5)

## 2013-04-07 LAB — POCT I-STAT TROPONIN I: Troponin i, poc: 0 ng/mL (ref 0.00–0.08)

## 2013-04-07 LAB — POCT I-STAT, CHEM 8
BUN: 23 mg/dL (ref 6–23)
Calcium, Ion: 1.18 mmol/L (ref 1.13–1.30)
Chloride: 105 meq/L (ref 96–112)
Creatinine, Ser: 1.4 mg/dL — ABNORMAL HIGH (ref 0.50–1.35)
Glucose, Bld: 98 mg/dL (ref 70–99)
HCT: 51 % (ref 39.0–52.0)
Hemoglobin: 17.3 g/dL — ABNORMAL HIGH (ref 13.0–17.0)
Potassium: 4.3 mEq/L (ref 3.7–5.3)
Sodium: 138 meq/L (ref 137–147)
TCO2: 21 mmol/L (ref 0–100)

## 2013-04-07 LAB — BASIC METABOLIC PANEL
BUN: 22 mg/dL (ref 6–23)
CHLORIDE: 99 meq/L (ref 96–112)
CO2: 22 mEq/L (ref 19–32)
Calcium: 9.4 mg/dL (ref 8.4–10.5)
Creatinine, Ser: 1.26 mg/dL (ref 0.50–1.35)
GFR calc Af Amer: 67 mL/min — ABNORMAL LOW (ref >90)
GFR calc non Af Amer: 58 mL/min — ABNORMAL LOW (ref >90)
GLUCOSE: 98 mg/dL (ref 70–99)
Potassium: 4.6 mEq/L (ref 3.7–5.3)
Sodium: 137 mEq/L (ref 137–147)

## 2013-04-07 MED ORDER — SODIUM CHLORIDE 0.9 % IV BOLUS (SEPSIS)
1000.0000 mL | Freq: Once | INTRAVENOUS | Status: AC
  Administered 2013-04-07: 1000 mL via INTRAVENOUS

## 2013-04-07 NOTE — ED Provider Notes (Signed)
CSN: [id number]     Arrival date & time 04/07/13  1640 History   First MD Initiated Contact with Patient 04/07/13 1711     Chief Complaint  Patient presents with  . Near Syncope  . Dizziness   (Consider location/radiation/quality/duration/timing/severity/associated sxs/prior Treatment) HPI Comments: Patient presents to the ED with a chief complaint of syncopal episode.  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  Patient states that he doesn't think he passed out completely, but he can't remember clearly.  Additionally, patient states that prior to the event, he could not remember what he was doing.  He reports a history of TIAs.  Patient also has a-fib, and takes cardizem and warfarin.  He states that no he feels fine, just a little weak.  He states that was recently sick with the flu.  He has not tried anything to alleviate his symptoms.  The history is provided by the patient. No language interpreter was used.    Past Medical History  Diagnosis Date  . Atrial fibrillation   . Hypertension    History reviewed. No pertinent past surgical history. History reviewed. No pertinent family history. History  Substance Use Topics  . Smoking status: Never Smoker   . Smokeless tobacco: Not on file  . Alcohol Use: No    Review of Systems  All other systems reviewed and are negative.    Allergies  Hydrochlorothiazide and Labetalol hcl  Home Medications   Current Outpatient Rx  Name  Route  Sig  Dispense  Refill  . diltiazem (CARDIZEM CD) 300 MG 24 hr capsule   Oral   Take 300 mg by mouth daily.         Marland Kitchen warfarin (COUMADIN) 5 MG tablet   Oral   Take 2.5-5 mg by mouth daily. Take 5mg  once daily on Sunday, Monday, Tuesday, Thursday, Friday, and Saturday.  Take 2.5 mg once daily on Wednesday.          BP 164/103  Pulse 84  Temp(Src) 97.6 F (36.4 C) (Oral)  Resp 18  SpO2  97% Physical Exam  Nursing note and vitals reviewed. Constitutional: He is oriented to person, place, and time. He appears well-developed and well-nourished.  HENT:  Head: Normocephalic and atraumatic.  Nose: Nose normal.  Mouth/Throat: Oropharynx is clear and moist. No oropharyngeal exudate.  Dry mucous membranes  Eyes: Conjunctivae and EOM are normal. Pupils are equal, round, and reactive to light. Right eye exhibits no discharge. Left eye exhibits no discharge. No scleral icterus.  Neck: Normal range of motion. Neck supple. No JVD present.  Cardiovascular: Normal rate, normal heart sounds and intact distal pulses.  Exam reveals no gallop and no friction rub.   No murmur heard. Irregularly irregular  Pulmonary/Chest: Effort normal and breath sounds normal. No respiratory distress. He has no wheezes. He has no rales. He exhibits no tenderness.  Abdominal: Soft. Bowel sounds are normal. He exhibits no distension and no mass. There is no tenderness. There is no rebound and no guarding.  Musculoskeletal: Normal range of motion. He exhibits no edema and no tenderness.  Neurological: He is alert and oriented to person, place, and time.  CN 3-12 intact, sensation and strength intact bilaterally, no pronator drift  Skin: Skin is warm and dry.  Psychiatric: He has a normal mood and affect. His behavior is normal. Judgment and thought content normal.    ED Course  Procedures (including critical care time) Results for orders placed during the hospital encounter of 04/07/13  CBC WITH DIFFERENTIAL      Result Value Range   WBC 10.6 (*) 4.0 - 10.5 K/uL   RBC 5.26  4.22 - 5.81 MIL/uL   Hemoglobin 16.1  13.0 - 17.0 g/dL   HCT 78.245.6  95.639.0 - 21.352.0 %   MCV 86.7  78.0 - 100.0 fL   MCH 30.6  26.0 - 34.0 pg   MCHC 35.3  30.0 - 36.0 g/dL   RDW 08.612.9  57.811.5 - 46.915.5 %   Platelets 279  150 - 400 K/uL   Neutrophils Relative % 54  43 - 77 %   Neutro Abs 5.8  1.7 - 7.7 K/uL   Lymphocytes Relative 35  12 - 46  %   Lymphs Abs 3.8  0.7 - 4.0 K/uL   Monocytes Relative 8  3 - 12 %   Monocytes Absolute 0.9  0.1 - 1.0 K/uL   Eosinophils Relative 2  0 - 5 %   Eosinophils Absolute 0.2  0.0 - 0.7 K/uL   Basophils Relative 1  0 - 1 %   Basophils Absolute 0.1  0.0 - 0.1 K/uL  BASIC METABOLIC PANEL      Result Value Range   Sodium 137  137 - 147 mEq/L   Potassium 4.6  3.7 - 5.3 mEq/L   Chloride 99  96 - 112 mEq/L   CO2 22  19 - 32 mEq/L   Glucose, Bld 98  70 - 99 mg/dL   BUN 22  6 - 23 mg/dL   Creatinine, Ser 6.291.26  0.50 - 1.35 mg/dL   Calcium 9.4  8.4 - 52.810.5 mg/dL   GFR calc non Af Amer 58 (*) >90 mL/min   GFR calc Af Amer 67 (*) >90 mL/min  POCT I-STAT, CHEM 8      Result Value Range   Sodium 138  137 - 147 mEq/L   Potassium 4.3  3.7 - 5.3 mEq/L   Chloride 105  96 - 112 mEq/L   BUN 23  6 - 23 mg/dL   Creatinine, Ser 4.131.40 (*) 0.50 - 1.35 mg/dL   Glucose, Bld 98  70 - 99 mg/dL   Calcium, Ion 2.441.18  0.101.13 - 1.30 mmol/L   TCO2 21  0 - 100 mmol/L   Hemoglobin 17.3 (*) 13.0 - 17.0 g/dL   HCT 27.251.0  53.639.0 - 64.452.0 %  POCT I-STAT TROPONIN I      Result Value Range   Troponin i, poc 0.00  0.00 - 0.08 ng/mL   Comment 3            Dg Chest 2 View  04/07/2013   CLINICAL DATA:  Chest pain  EXAM: CHEST  2 VIEW  COMPARISON:  07/04/2003  FINDINGS: Low lung volumes. The cardiac silhouette is enlarged. Aorta is tortuous. The lungs are clear. The osseous structures unremarkable.  IMPRESSION: No active cardiopulmonary disease.   Electronically Signed   By: Salome HolmesHector  Cooper M.D.   On: 04/07/2013 18:06   Ct Head Wo Contrast  04/07/2013   CLINICAL DATA:  NEAR SYNCOPE DIZZINESS memory loss  EXAM: CT HEAD WITHOUT CONTRAST  TECHNIQUE: Contiguous axial images were obtained from the base of the skull through the vertex without intravenous contrast.  COMPARISON:  None.  FINDINGS: No acute intracranial hemorrhage. No focal mass lesion. No CT evidence of acute infarction. No midline shift or mass effect. No hydrocephalus. Basilar  cisterns are patent. Vascular calcifications of the vessels of the circle of Willis. Mild diffuse cortical atrophy and periventricular white matter hypodensities.  There is mucosal thickening in the maxillary sinuses. The frontal sinuses are clear. Mastoid air cells are clear.  IMPRESSION: 1. No acute intracranial findings. 2. Mild atrophy and chronic microvascular disease. 3. Maxillary sinusitis   Electronically Signed   By: Genevive Bi M.D.   On: 04/07/2013 18:02      EKG Interpretation   None       MDM   1. Syncope     Patient with syncopal episode.  History of TIAs and Afib.  Will check labs, CT, and EKG.  Labs are reassuring.  Meets [facility] syncope rules, no new changes in EKG, no anemia, no shortness of breath, and vitals are stable. Patient feels well. Will discharge to home. I discussed patient with Dr. Oletta Lamas, who agrees with the plan. Does not sound to be cardiac in nature, no chest pain. Believe that the patient syncopized due to exertion; possibly dehydration. Patient recently had influenza. Today was his first day back at work. 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    Roxy Horseman, PA-C 04/07/13 1931

## 2013-04-07 NOTE — Discharge Instructions (Signed)
Near-Syncope °Near-syncope (commonly known as near fainting) is sudden weakness, dizziness, or feeling like you might pass out. During an episode of near-syncope, you may also develop pale skin, have tunnel vision, or feel sick to your stomach (nauseous). Near-syncope may occur when getting up after sitting or while standing for a long time. It is caused by a sudden decrease in blood flow to the brain. This decrease can result from various causes or triggers, most of which are not serious. However, because near-syncope can sometimes be a sign of something serious, a medical evaluation is required. The specific cause is often not determined. °HOME CARE INSTRUCTIONS  °Monitor your condition for any changes. The following actions may help to alleviate any discomfort you are experiencing: °· Have someone stay with you until you feel stable. °· Lie down right away if you start feeling like you might faint. Breathe deeply and steadily. Wait until all the symptoms have passed. Most of these episodes last only a few minutes. You may feel tired for several hours.   °· Drink enough fluids to keep your urine clear or pale yellow.   °· If you are taking blood pressure or heart medicine, get up slowly when seated or lying down. Take several minutes to sit and then stand. This can reduce dizziness. °· Follow up with your health care provider as directed.  °SEEK IMMEDIATE MEDICAL CARE IF:  °· You have a severe headache.   °· You have unusual pain in the chest, abdomen, or back.   °· You are bleeding from the mouth or rectum, or you have black or tarry stool.   °· You have an irregular or very fast heartbeat.   °· You have repeated fainting or have seizure-like jerking during an episode.   °· You faint when sitting or lying down.   °· You have confusion.   °· You have difficulty walking.   °· You have severe weakness.   °· You have vision problems.   °MAKE SURE YOU:  °· Understand these instructions. °· Will watch your  condition. °· Will get help right away if you are not doing well or get worse. °Document Released: 03/09/2005 Document Revised: 11/09/2012 Document Reviewed: 08/12/2012 °[employer]® Patient Information ©2014 [employer]. ° °

## 2013-04-07 NOTE — ED Provider Notes (Signed)
Medical screening examination/treatment/procedure(s) were performed by non-physician practitioner and as supervising physician I was immediately available for consultation/collaboration.  EKG Interpretation    Date/Time:  Friday April 07 2013 16:58:01 EST Ventricular Rate:  86 PR Interval:    QRS Duration: 78 QT Interval:  353 QTC Calculation: 422 R Axis:   -58 Text Interpretation:  Atrial fibrillation LVH with secondary repolarization abnormality Left anterior fasicular block Anterolateral infarct, age indeterminate Baseline wander in lead(s) V2 V5 No significant change since last tracing Abnormal ekg Confirmed by [facility]  MD, MICHEAL [phone] on 04/07/2013 7:25:48 PM              Gavin PoundMichael Y. Oletta LamasGhim, MD 04/07/13 [id number]

## 2013-04-07 NOTE — ED Notes (Signed)
Pt states he had episode of near syncope while at work. Pt states he feels dizzy. Denies pain, sob, fall or injury.

## 2013-04-07 NOTE — ED Notes (Signed)
Transported to xray 

## 2014-08-01 IMAGING — CT CT HEAD W/O CM
2 series · 16 of 30 positions shown, 20 images · non-contrast
Comparison: None.

CLINICAL DATA: NEAR SYNCOPE DIZZINESS memory loss

EXAM:
CT HEAD WITHOUT CONTRAST
TECHNIQUE: Contiguous axial images were obtained from the base of the skull
through the vertex without intravenous contrast.

[Series 2: head w/o · axial · non-contrast · 0.48mm/px · z∈[-91,+44]mm · 13 of 33 slices shown, 17 images]
[im 3/33  brain]
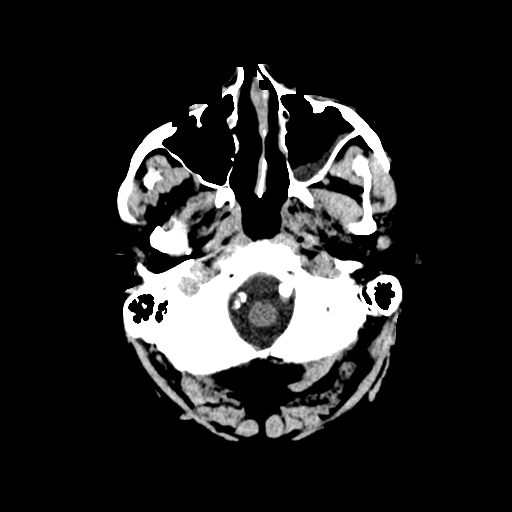
[im 3/33  bone]
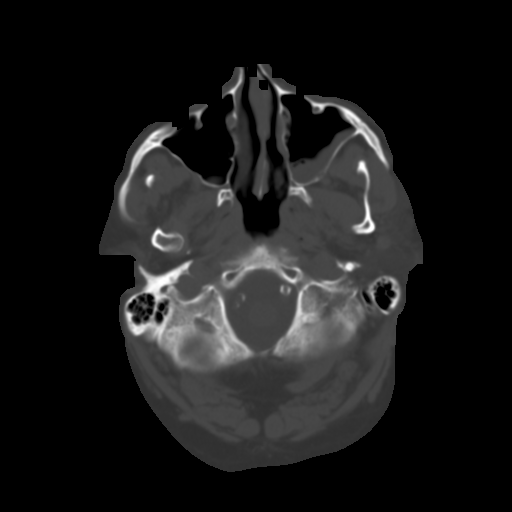
[im 5/33  brain]
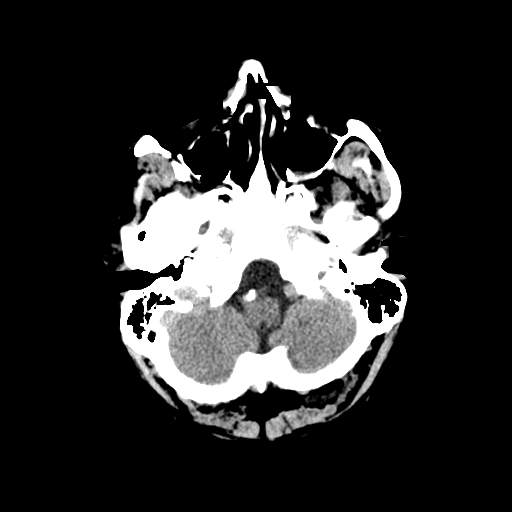
[im 7/33  brain]
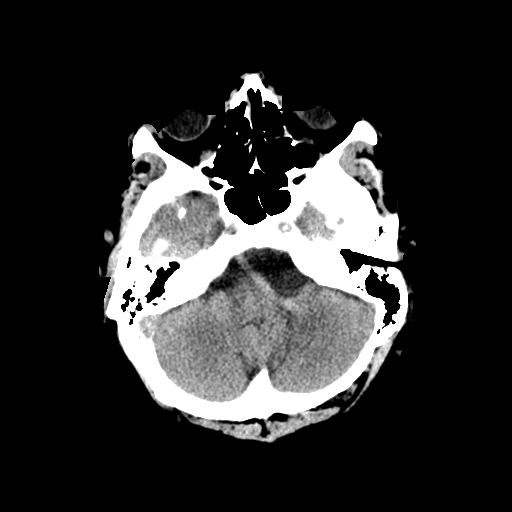
[im 10/33  brain]
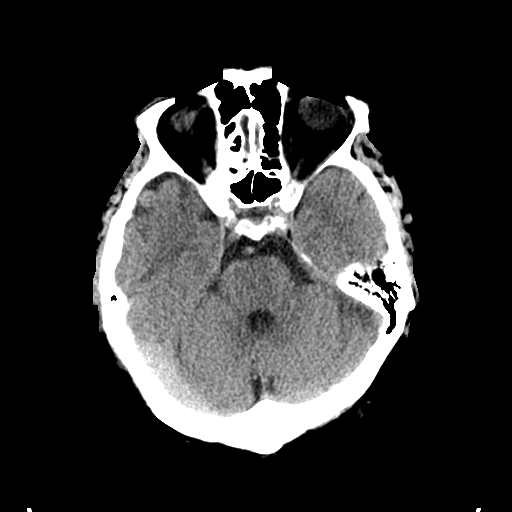
[im 12/33  brain]
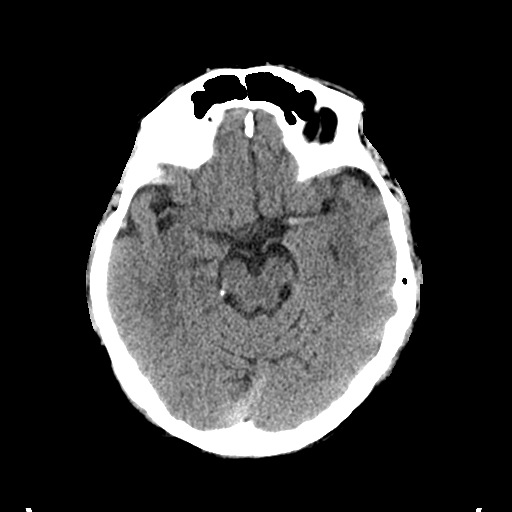
[im 12/33  bone]
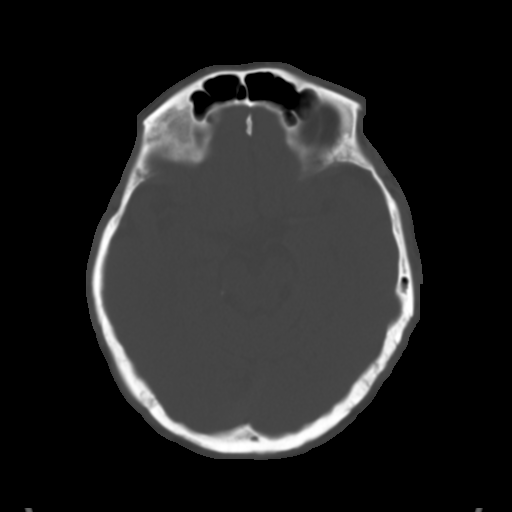
[im 14/33  brain]
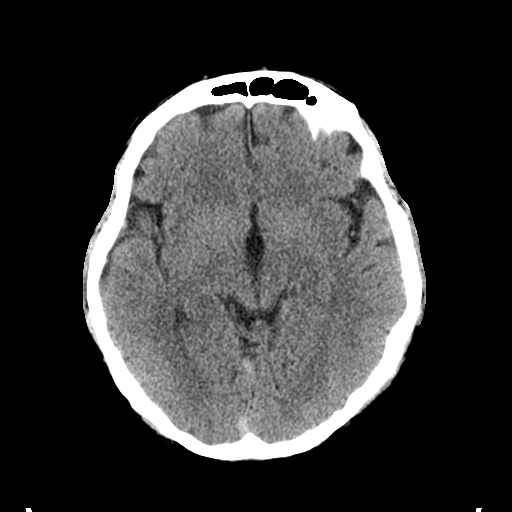
[im 17/33  brain]
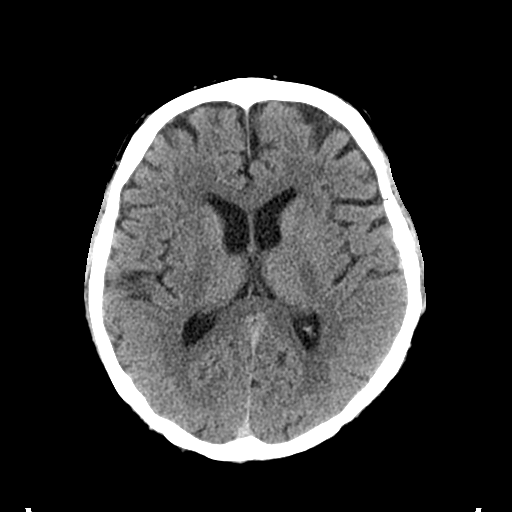
[im 19/33  brain]
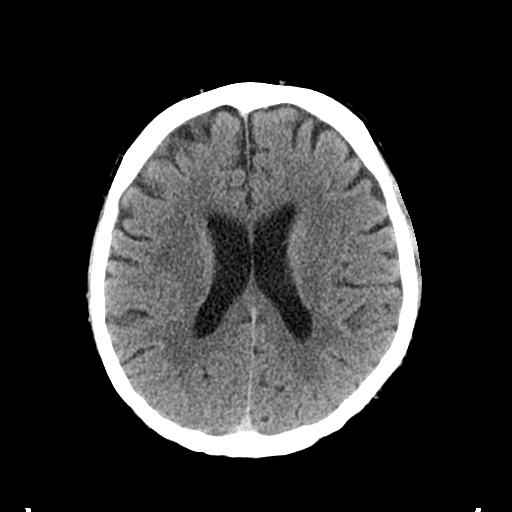
[im 21/33  brain]
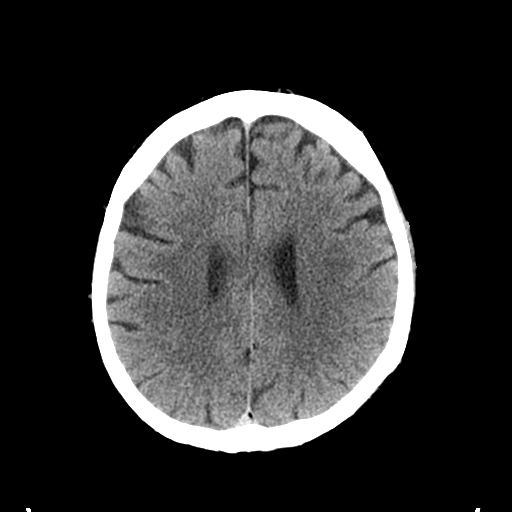
[im 21/33  bone]
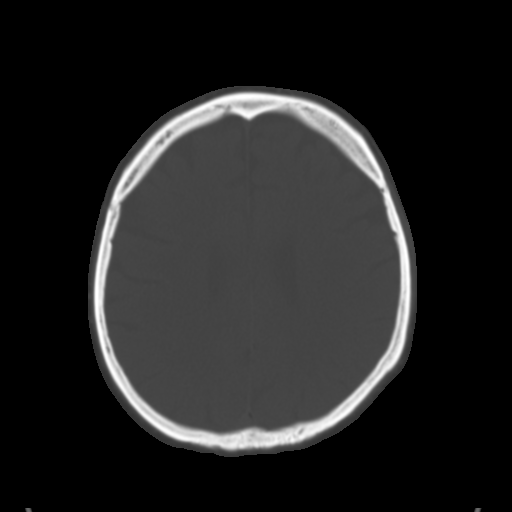
[im 23/33  brain]
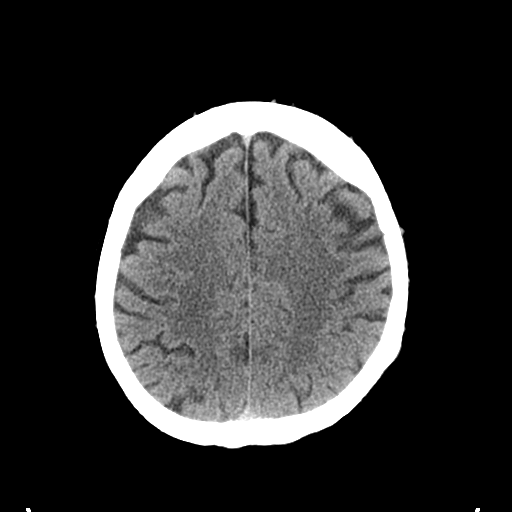
[im 26/33  brain]
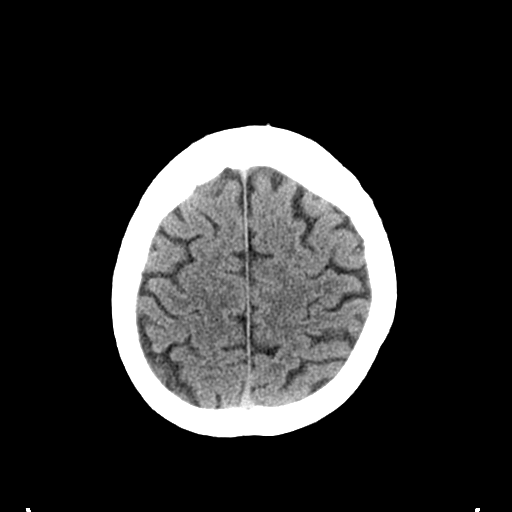
[im 28/33  brain]
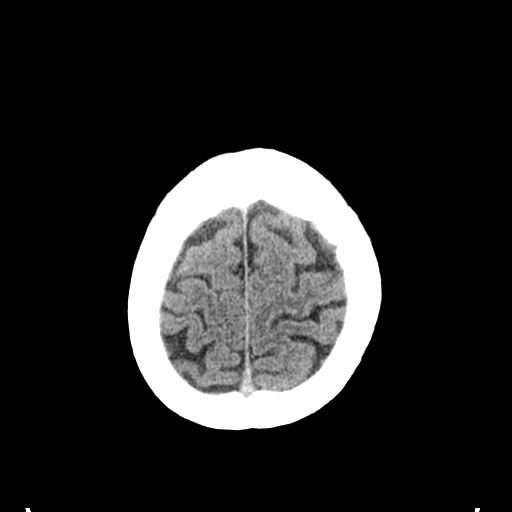
[im 30/33  brain]
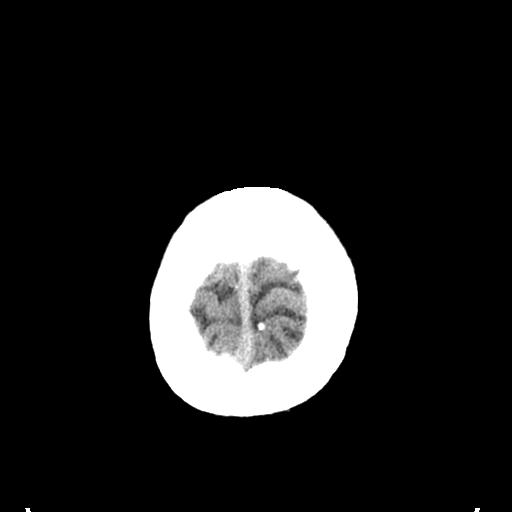
[im 30/33  bone]
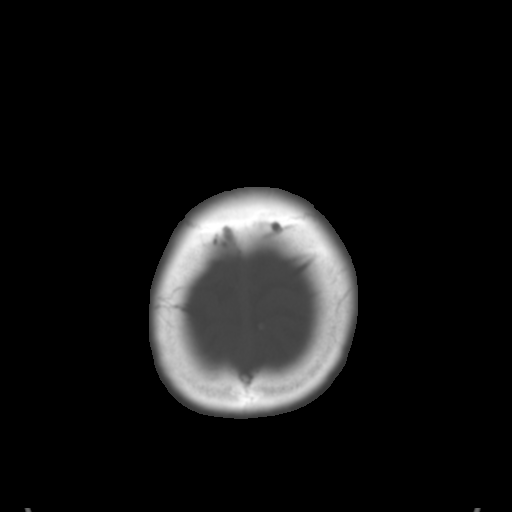

[Series 3: bone windows · axial · 0.48mm/px · z∈[-91,-46]mm · 3 of 33 slices shown]
[im 3/33  bone]
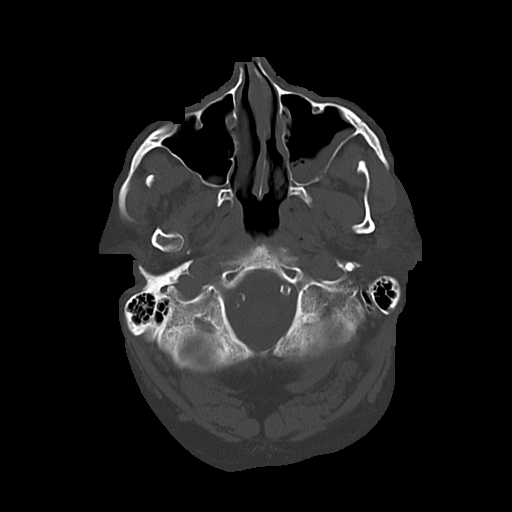
[im 7/33  bone]
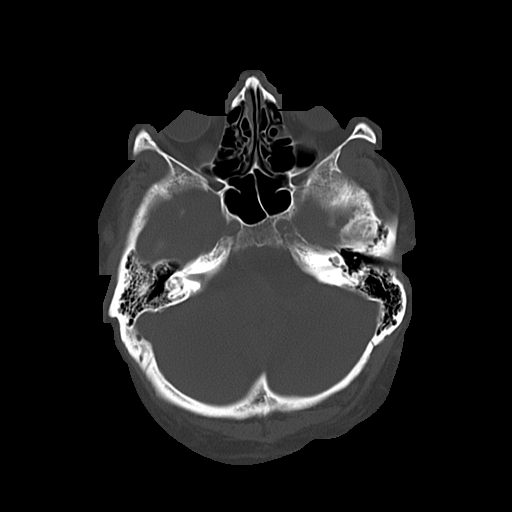
[im 12/33  bone]
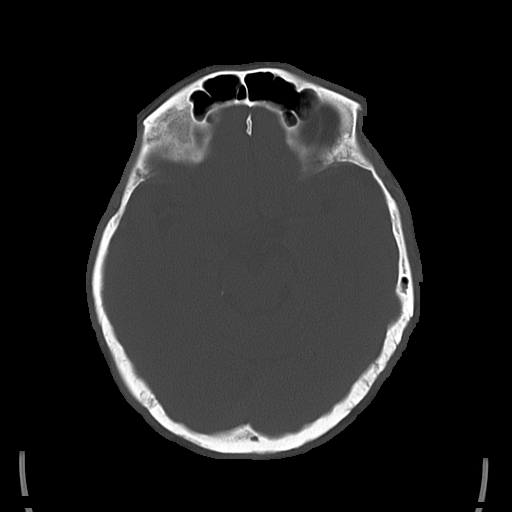

[16 of 30 positions shown; findings below may reference images not displayed]

FINDINGS: No acute intracranial hemorrhage. No focal mass lesion. No CT
evidence of acute infarction. No midline shift or mass effect. No
hydrocephalus. Basilar cisterns are patent. Vascular calcifications
of the vessels of the circle of Willis. Mild diffuse cortical
atrophy and periventricular white matter hypodensities.

There is mucosal thickening in the maxillary sinuses. The frontal
sinuses are clear. Mastoid air cells are clear.
IMPRESSION: 1. No acute intracranial findings.
2. Mild atrophy and chronic microvascular disease.
3. Maxillary sinusitis

## 2014-11-20 ENCOUNTER — Encounter: Payer: Self-pay | Admitting: Internal Medicine

## 2017-07-15 ENCOUNTER — Encounter: Payer: Self-pay | Admitting: Internal Medicine

## 2017-12-06 ENCOUNTER — Other Ambulatory Visit: Payer: Self-pay | Admitting: Internal Medicine

## 2017-12-06 DIAGNOSIS — Z0001 Encounter for general adult medical examination with abnormal findings: Secondary | ICD-10-CM

## 2017-12-17 ENCOUNTER — Other Ambulatory Visit: Payer: Non-veteran care

## 2017-12-17 ENCOUNTER — Ambulatory Visit
Admission: RE | Admit: 2017-12-17 | Discharge: 2017-12-17 | Disposition: A | Discharge status: Home / Self Care | Payer: Non-veteran care | Source: Ambulatory Visit | Attending: Internal Medicine | Admitting: Internal Medicine

## 2017-12-17 DIAGNOSIS — Z0001 Encounter for general adult medical examination with abnormal findings: Secondary | ICD-10-CM

## 2019-01-31 IMAGING — US US CAROTID DUPLEX BILAT
1 series · 13 of 24 positions shown · non-contrast
Comparison: None.

CLINICAL DATA: Hypertension.

EXAM:
BILATERAL CAROTID DUPLEX ULTRASOUND
TECHNIQUE: Gray scale imaging, color Doppler and duplex ultrasound were
performed of bilateral carotid and vertebral arteries in the neck.

[Series 1: us carotid duplex bilat · 0.05mm/px · 13 of 61 slices shown]
[im 1/61]
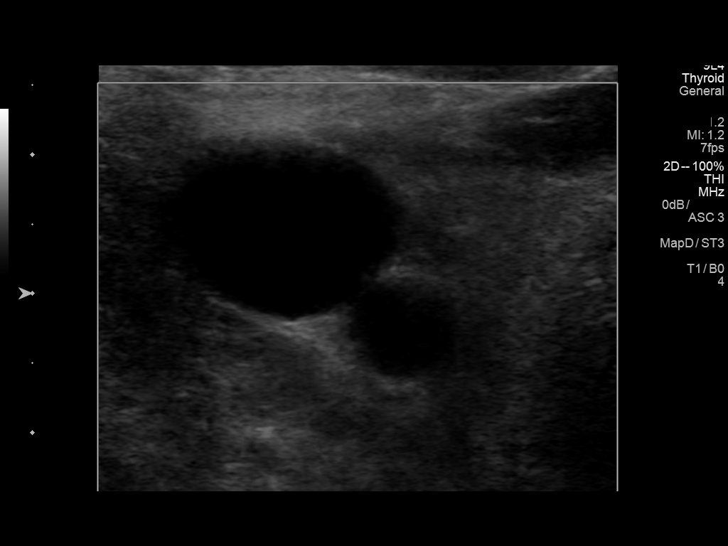
[im 6/61]
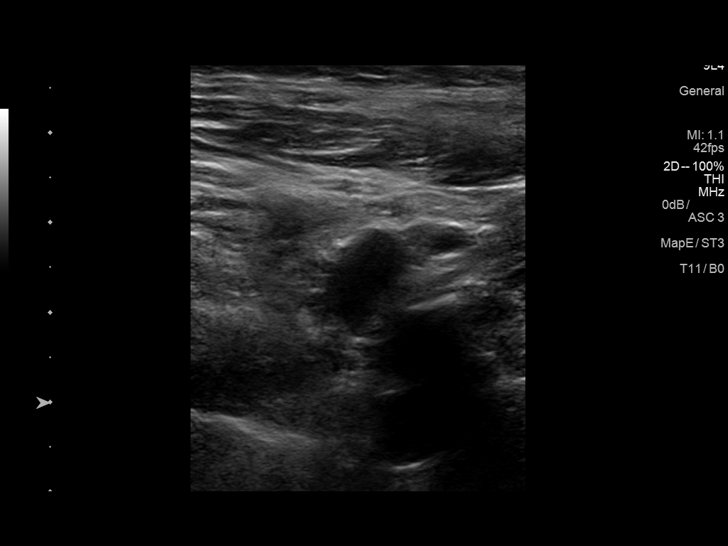
[im 11/61]
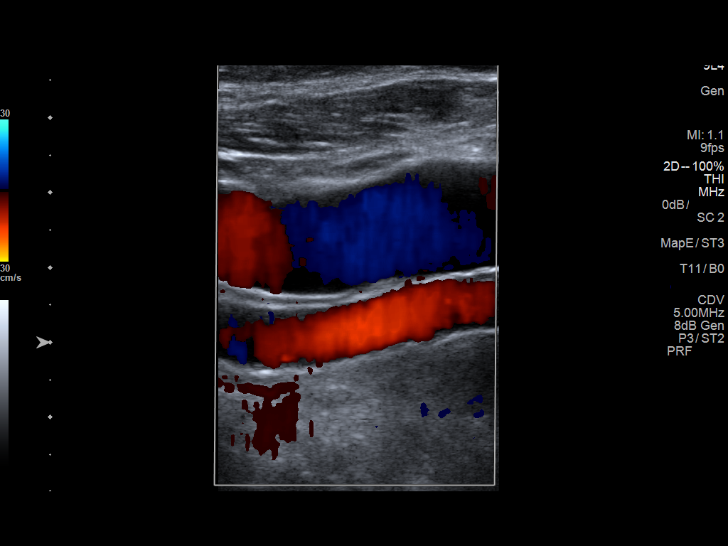
[im 16/61]
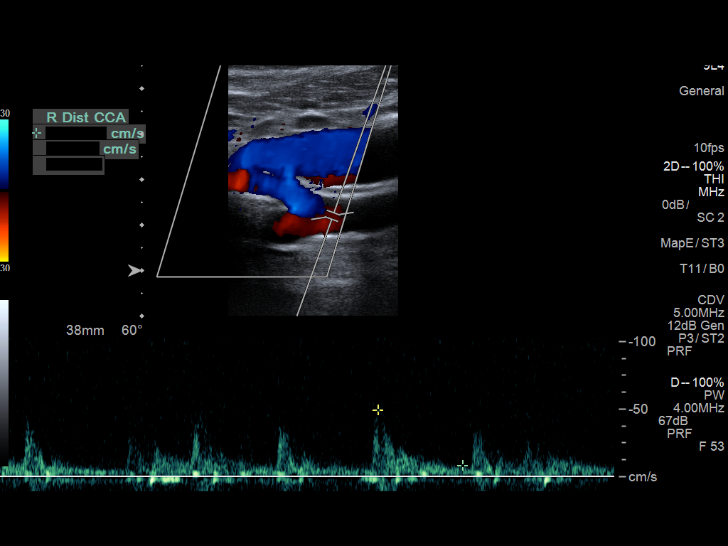
[im 21/61]
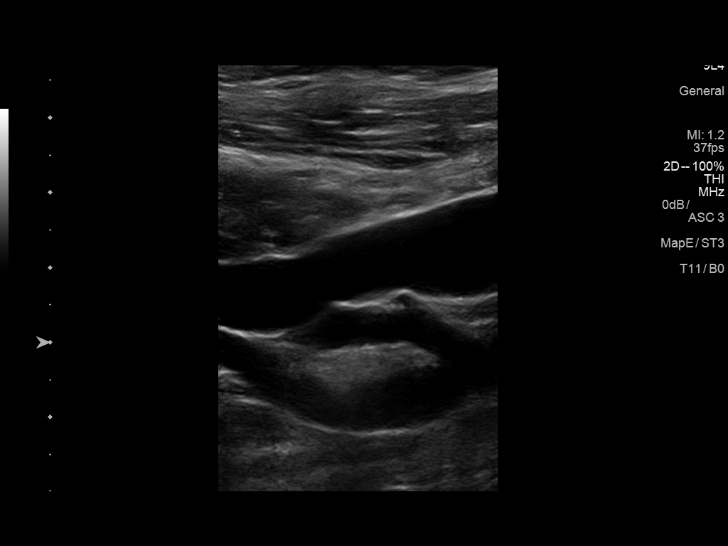
[im 27/61]
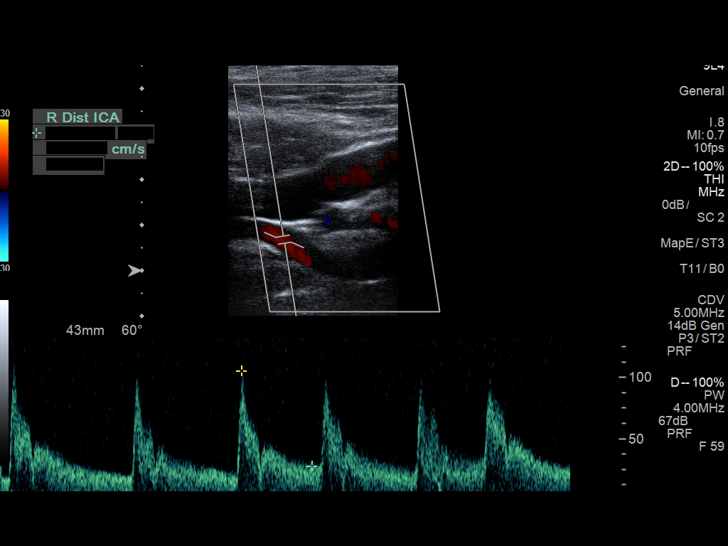
[im 32/61]
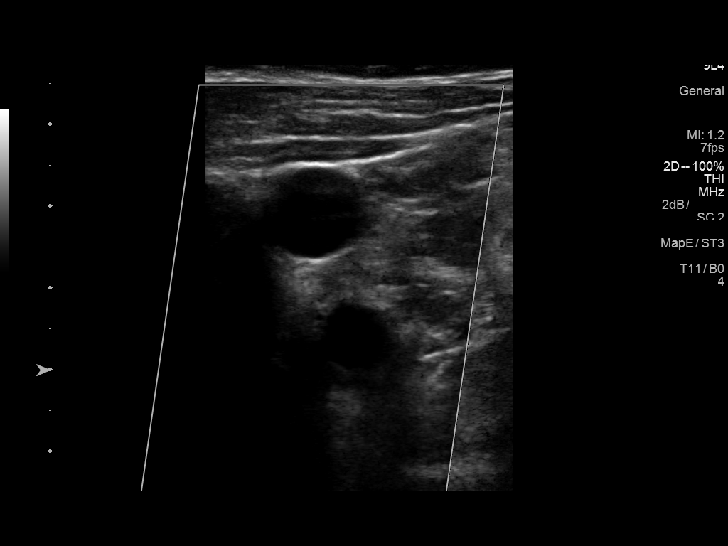
[im 34/61]
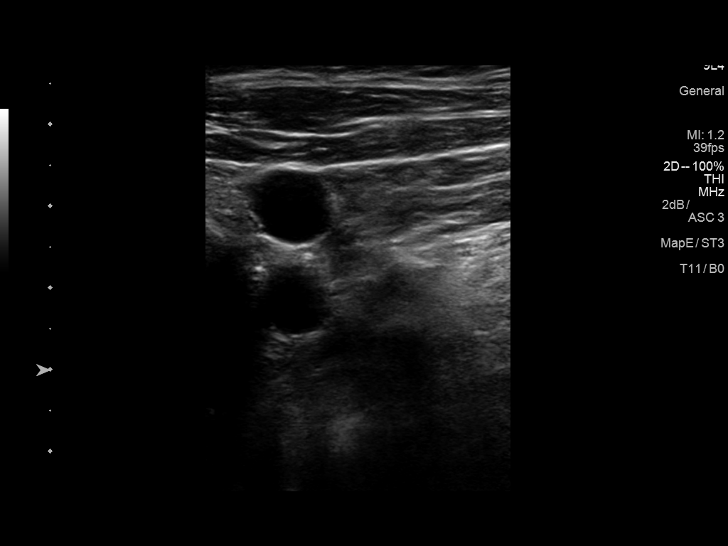
[im 40/61]
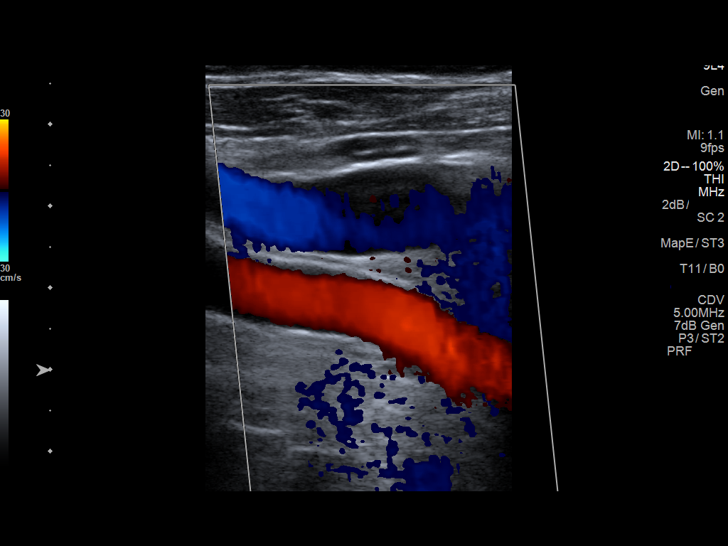
[im 45/61]
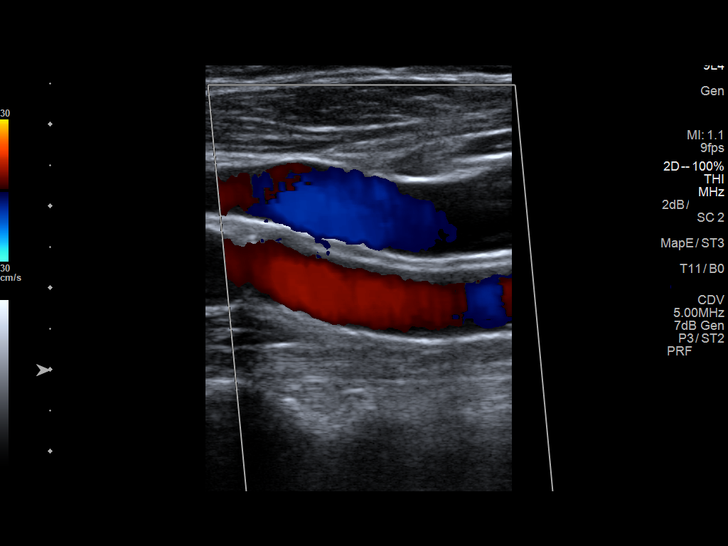
[im 50/61]
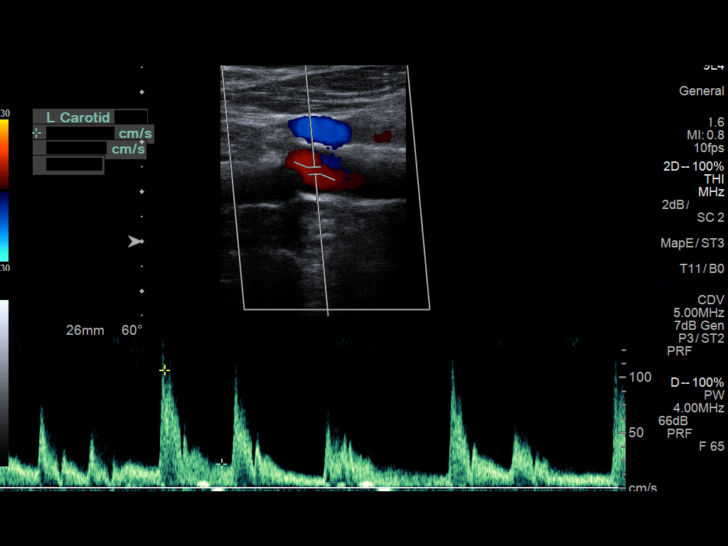
[im 55/61]
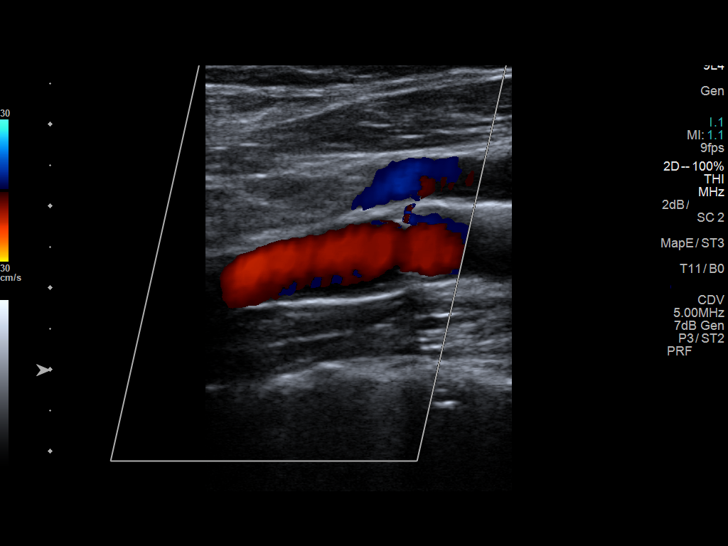
[im 61/61]
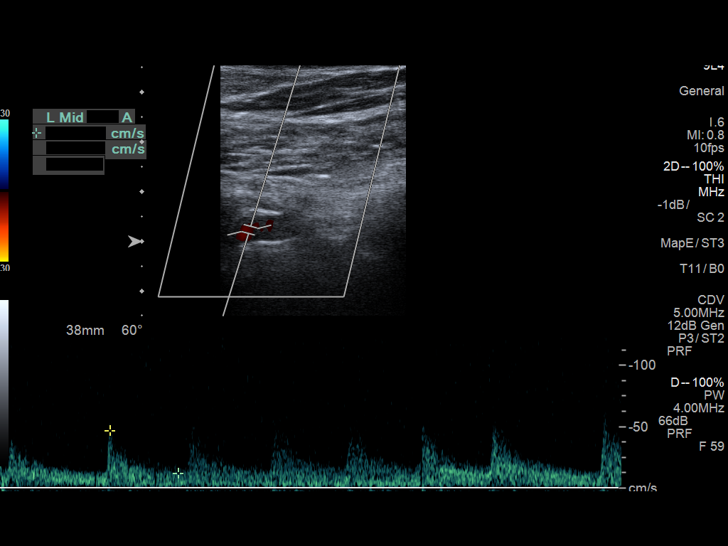

[13 of 24 positions shown; findings below may reference images not displayed]

FINDINGS: Criteria: Quantification of carotid stenosis is based on velocity
parameters that correlate the residual internal carotid diameter
with NASCET-based stenosis levels, using the diameter of the distal
internal carotid lumen as the denominator for stenosis measurement.

The following velocity measurements were obtained:

RIGHT

ICA:  105/28 cm/sec

CCA:  104/17 cm/sec

SYSTOLIC ICA/CCA RATIO:

ECA:  91 cm/sec

LEFT

ICA:  75/20 cm/sec

CCA:  104/21 cm/sec

SYSTOLIC ICA/CCA RATIO:

ECA:  141 cm/sec

RIGHT CAROTID ARTERY: No focal plaque identified. No evidence of
carotid stenosis. Heart rate noted to be irregular. Correlation
suggested with any history of arrhythmia.

RIGHT VERTEBRAL ARTERY: Antegrade flow with normal waveform and
velocity.

LEFT CAROTID ARTERY: Mild plaque at the level of the left carotid
bulb and ICA origin. Estimated left ICA stenosis is less than 50%.

LEFT VERTEBRAL ARTERY: Antegrade flow with normal waveform and
velocity.
IMPRESSION: 1. Mild plaque at the left ICA origin. Estimated left ICA stenosis
is less than 50%.
2. No evidence of right-sided carotid plaque or stenosis.
3. Throughout the examination, the heart rate was noted to be
irregular. Correlation suggested with any history of arrhythmia such
as atrial fibrillation.
